# Patient Record
Sex: Male | Born: 1955 | Race: White | Hispanic: No | Smoking: Former smoker
Health system: Southern US, Community
[De-identification: ages and names within clinical notes are randomized; demographics above are authoritative.]

## PROBLEM LIST (undated history)

## (undated) DIAGNOSIS — K567 Ileus, unspecified: Secondary | ICD-10-CM

## (undated) DIAGNOSIS — D62 Acute posthemorrhagic anemia: Secondary | ICD-10-CM

## (undated) DIAGNOSIS — I219 Acute myocardial infarction, unspecified: Secondary | ICD-10-CM

## (undated) DIAGNOSIS — E876 Hypokalemia: Secondary | ICD-10-CM

## (undated) DIAGNOSIS — I10 Essential (primary) hypertension: Secondary | ICD-10-CM

## (undated) DIAGNOSIS — G8929 Other chronic pain: Secondary | ICD-10-CM

## (undated) DIAGNOSIS — D72829 Elevated white blood cell count, unspecified: Secondary | ICD-10-CM

## (undated) DIAGNOSIS — K922 Gastrointestinal hemorrhage, unspecified: Secondary | ICD-10-CM

## (undated) DIAGNOSIS — R079 Chest pain, unspecified: Secondary | ICD-10-CM

## (undated) DIAGNOSIS — K219 Gastro-esophageal reflux disease without esophagitis: Secondary | ICD-10-CM

## (undated) DIAGNOSIS — I639 Cerebral infarction, unspecified: Secondary | ICD-10-CM

## (undated) DIAGNOSIS — I739 Peripheral vascular disease, unspecified: Secondary | ICD-10-CM

## (undated) DIAGNOSIS — Z9049 Acquired absence of other specified parts of digestive tract: Secondary | ICD-10-CM

## (undated) DIAGNOSIS — R0600 Dyspnea, unspecified: Secondary | ICD-10-CM

## (undated) DIAGNOSIS — I25119 Atherosclerotic heart disease of native coronary artery with unspecified angina pectoris: Secondary | ICD-10-CM

## (undated) DIAGNOSIS — R7989 Other specified abnormal findings of blood chemistry: Secondary | ICD-10-CM

## (undated) DIAGNOSIS — K8020 Calculus of gallbladder without cholecystitis without obstruction: Secondary | ICD-10-CM

## (undated) DIAGNOSIS — J45909 Unspecified asthma, uncomplicated: Secondary | ICD-10-CM

## (undated) DIAGNOSIS — K9189 Other postprocedural complications and disorders of digestive system: Secondary | ICD-10-CM

## (undated) DIAGNOSIS — M542 Cervicalgia: Secondary | ICD-10-CM

## (undated) DIAGNOSIS — J479 Bronchiectasis, uncomplicated: Secondary | ICD-10-CM

## (undated) DIAGNOSIS — K5732 Diverticulitis of large intestine without perforation or abscess without bleeding: Secondary | ICD-10-CM

## (undated) DIAGNOSIS — E782 Mixed hyperlipidemia: Secondary | ICD-10-CM

## (undated) DIAGNOSIS — G459 Transient cerebral ischemic attack, unspecified: Secondary | ICD-10-CM

## (undated) DIAGNOSIS — M545 Low back pain: Secondary | ICD-10-CM

## (undated) DIAGNOSIS — E785 Hyperlipidemia, unspecified: Secondary | ICD-10-CM

## (undated) DIAGNOSIS — J471 Bronchiectasis with (acute) exacerbation: Secondary | ICD-10-CM

## (undated) DIAGNOSIS — M7541 Impingement syndrome of right shoulder: Secondary | ICD-10-CM

## (undated) HISTORY — DX: Acute myocardial infarction, unspecified: I21.9

## (undated) HISTORY — DX: Ileus, unspecified: K56.7

## (undated) HISTORY — PX: ROTATOR CUFF REPAIR: SHX139

## (undated) HISTORY — DX: Transient cerebral ischemic attack, unspecified: G45.9

## (undated) HISTORY — DX: Mixed hyperlipidemia: E78.2

## (undated) HISTORY — PX: CORONARY STENT PLACEMENT: SHX1402

## (undated) HISTORY — DX: Hyperlipidemia, unspecified: E78.5

## (undated) HISTORY — DX: Essential (primary) hypertension: I10

## (undated) HISTORY — DX: Other postprocedural complications and disorders of digestive system: K91.89

## (undated) HISTORY — DX: Diverticulitis of large intestine without perforation or abscess without bleeding: K57.32

## (undated) HISTORY — DX: Gastro-esophageal reflux disease without esophagitis: K21.9

## (undated) HISTORY — DX: Acute posthemorrhagic anemia: D62

## (undated) HISTORY — DX: Bronchiectasis, uncomplicated: J47.9

## (undated) HISTORY — PX: JOINT REPLACEMENT: SHX530

## (undated) HISTORY — DX: Atherosclerotic heart disease of native coronary artery with unspecified angina pectoris: I25.119

## (undated) HISTORY — DX: Impingement syndrome of right shoulder: M75.41

## (undated) HISTORY — DX: Peripheral vascular disease, unspecified: I73.9

## (undated) HISTORY — DX: Other specified abnormal findings of blood chemistry: R79.89

## (undated) HISTORY — DX: Chest pain, unspecified: R07.9

## (undated) HISTORY — DX: Gastrointestinal hemorrhage, unspecified: K92.2

## (undated) HISTORY — DX: Bronchiectasis with (acute) exacerbation: J47.1

## (undated) HISTORY — PX: TOTAL HIP ARTHROPLASTY: SHX124

## (undated) HISTORY — DX: Cerebral infarction, unspecified: I63.9

## (undated) HISTORY — DX: Acquired absence of other specified parts of digestive tract: Z90.49

## (undated) HISTORY — DX: Low back pain: M54.5

## (undated) HISTORY — DX: Elevated white blood cell count, unspecified: D72.829

## (undated) HISTORY — DX: Hypokalemia: E87.6

## (undated) HISTORY — DX: Other chronic pain: G89.29

## (undated) HISTORY — DX: Other disorders of phosphorus metabolism: E83.39

## (undated) HISTORY — DX: Calculus of gallbladder without cholecystitis without obstruction: K80.20

## (undated) HISTORY — DX: Cervicalgia: M54.2

---

## 2002-11-25 ENCOUNTER — Encounter: Payer: Self-pay | Admitting: Orthopedic Surgery

## 2002-11-25 ENCOUNTER — Inpatient Hospital Stay (HOSPITAL_COMMUNITY): Admission: RE | Admit: 2002-11-25 | Discharge: 2002-11-29 | Payer: Self-pay | Admitting: Orthopedic Surgery

## 2004-02-02 ENCOUNTER — Inpatient Hospital Stay (HOSPITAL_COMMUNITY): Admission: RE | Admit: 2004-02-02 | Discharge: 2004-02-06 | Payer: Self-pay | Admitting: Orthopedic Surgery

## 2006-11-27 ENCOUNTER — Observation Stay (HOSPITAL_COMMUNITY): Admission: RE | Admit: 2006-11-27 | Discharge: 2006-11-28 | Payer: Self-pay | Admitting: Orthopedic Surgery

## 2006-11-27 ENCOUNTER — Ambulatory Visit: Payer: Self-pay | Admitting: Cardiovascular Disease

## 2006-12-18 ENCOUNTER — Ambulatory Visit (HOSPITAL_COMMUNITY): Admission: RE | Admit: 2006-12-18 | Discharge: 2006-12-19 | Payer: Self-pay | Admitting: Orthopedic Surgery

## 2009-08-15 ENCOUNTER — Encounter: Admission: RE | Admit: 2009-08-15 | Discharge: 2009-08-15 | Payer: Self-pay | Admitting: Neurology

## 2009-11-14 ENCOUNTER — Encounter: Payer: Self-pay | Admitting: Pulmonary Disease

## 2010-01-13 ENCOUNTER — Encounter: Payer: Self-pay | Admitting: Pulmonary Disease

## 2010-03-27 ENCOUNTER — Encounter: Payer: Self-pay | Admitting: Pulmonary Disease

## 2010-05-14 ENCOUNTER — Institutional Professional Consult (permissible substitution) (INDEPENDENT_AMBULATORY_CARE_PROVIDER_SITE_OTHER): Payer: Medicaid Other | Admitting: Pulmonary Disease

## 2010-05-14 ENCOUNTER — Encounter: Payer: Self-pay | Admitting: Pulmonary Disease

## 2010-05-14 ENCOUNTER — Other Ambulatory Visit: Payer: Self-pay | Admitting: Pulmonary Disease

## 2010-05-14 ENCOUNTER — Ambulatory Visit (INDEPENDENT_AMBULATORY_CARE_PROVIDER_SITE_OTHER)
Admission: RE | Admit: 2010-05-14 | Discharge: 2010-05-14 | Disposition: A | Discharge status: Home / Self Care | Payer: Medicaid Other | Source: Ambulatory Visit | Attending: Pulmonary Disease | Admitting: Pulmonary Disease

## 2010-05-14 DIAGNOSIS — G459 Transient cerebral ischemic attack, unspecified: Secondary | ICD-10-CM

## 2010-05-14 DIAGNOSIS — I10 Essential (primary) hypertension: Secondary | ICD-10-CM | POA: Insufficient documentation

## 2010-05-14 DIAGNOSIS — J329 Chronic sinusitis, unspecified: Secondary | ICD-10-CM

## 2010-05-14 DIAGNOSIS — I219 Acute myocardial infarction, unspecified: Secondary | ICD-10-CM

## 2010-05-14 DIAGNOSIS — J189 Pneumonia, unspecified organism: Secondary | ICD-10-CM | POA: Insufficient documentation

## 2010-05-14 DIAGNOSIS — E785 Hyperlipidemia, unspecified: Secondary | ICD-10-CM | POA: Insufficient documentation

## 2010-05-14 HISTORY — DX: Essential (primary) hypertension: I10

## 2010-05-14 HISTORY — DX: Transient cerebral ischemic attack, unspecified: G45.9

## 2010-05-14 HISTORY — DX: Acute myocardial infarction, unspecified: I21.9

## 2010-05-23 ENCOUNTER — Encounter: Payer: Self-pay | Admitting: Pulmonary Disease

## 2010-05-31 NOTE — Assessment & Plan Note (Signed)
Summary:  consult for recurrent pna   Visit Type:  Initial Consult Copy to:  Gary Diaz Primary Provider/Referring Provider:  [name] Diaz  CC:  Pulmonary consult. Gary Diaz is here bc he keeps getting recurring pna and wants to know why .  History of Present Illness: the Gary Diaz is a 55y/o male who I have been asked to see for recurrent pulmonary infections.  The Gary Diaz states he was in his usual state of health with no significant pulmonary complaints, until Aug of last year when he was diagnosed with "pna".  He required hospitalization for this, and subsequently further episodes in Oct and Jan of this year.  I have a ct chest report from Aug which notes LLL infiltrate, and patchy densities in both lower lobes.  A ct report from Oct of 2011 comments on a patchy infiltrate in LLL,  but it is unclear if this ever resolved from Aug? He was admitted in Jan of this year with the discharge summary mentioning LLL infiltrate.  Films are not available from any of these studies for my review.  The Gary Diaz states that he currently feels much better, and is staying on spiriva with as needed albuterol.  He feels he can walk long distances without sob, and has no issues bringing groceries in from the car or walking up steps.  He reports a mild cough, but no significant mucus.  He had one episode of streak hemoptysis this am, but also has been having epistaxis.  He does note nasal congestion with sinus pressure and discolored mucus from nares.  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   Preventive Screening-Counseling & Management  Alcohol-Tobacco     Smoking Status: quit  Current Medications (verified): 1)  Ventolin Hfa 108 (90 Base) Mcg/act Aers (Albuterol Sulfate) .... 2 Puffs Every 4 Hrs As Needed 2)  Combivent 18-103 Mcg/act Aero (Ipratropium-Albuterol) .... 2 Puffs Every 4 Hrs As Needed 3)  Livalo 2 Mg Tabs (Pitavastatin Calcium) .... Once Daily At Bedtime 4)  Spiriva  Handihaler 18 Mcg Caps (Tiotropium Bromide Monohydrate) .... Once Daily 5)  Plavix 75 Mg Tabs (Clopidogrel Bisulfate) .... Once Daily 6)  Amlodipine Besylate 5 Mg Tabs (Amlodipine Besylate) .... Once Daily 7)  Diclofenac Sodium 75 Mg Tbec (Diclofenac Sodium) .Marland Kitchen.. 1 Two Times A Day 8)  Loratadine 10 Mg Tabs (Loratadine) .... Once Daily 9)  Multivitamins  Tabs (Multiple Vitamin) .... Once Daily 10)  Topiramate 100 Mg Tabs (Topiramate) .Marland Kitchen.. 1 Two Times A Day 11)  Omeprazole 40 Mg Cpdr (Omeprazole) .Marland Kitchen.. 1 Two Times A Day 12)  Enalapril Maleate 20 Mg Tabs (Enalapril Maleate) .... Once Daily 13)  Hydrocodone-Acetaminophen 10-650 Mg Tabs (Hydrocodone-Acetaminophen) .Marland Kitchen.. 1 Every 8 Hrs As Needed 14)  Diazepam 10 Mg Tabs (Diazepam) .Marland Kitchen.. 1 Every 8 Hrs As Needed 15)  Sennosides-Docusate Sodium 8.6-50 Mg Tabs (Sennosides-Docusate Sodium) .... 2 Two Times A Day As Needed 16)  Nitrostat 0.4 Mg Subl (Nitroglycerin) .... As Needed  Allergies (verified): 1)  ! 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 HYPERTENSION (ICD-401.9)    Past Surgical History: 3 stents placement for cad 2 hip replacements right rotator cuff repair  Family History: Reviewed history and no changes required. rheumatism--mother htn: mother allergies: mother  Social History: Reviewed history and no changes required.  Patient states former smoker. quit using cigars in 1990's. 6 cigars a day. disabled--mechanic single drinks beers occasionallySmoking Status:  quit  Review of Systems       The patient complains of shortness of breath with activity, productive cough, coughing up blood, chest pain, irregular heartbeats, acid heartburn, indigestion, loss of appetite, weight change, difficulty swallowing, sore throat, nasal  congestion/difficulty breathing through nose, sneezing, hand/feet swelling, and joint stiffness or pain.  The patient denies shortness of breath at rest, non-productive cough, abdominal pain, tooth/dental problems, headaches, itching, ear ache, anxiety, depression, rash, change in color of mucus, and fever.    Vital Signs:  Patient profile:   55 year old male Height:      69 inches Weight:      183.38 pounds BMI:     27.18 O2 Sat:      96 % on Room air Pulse rate:   82 / minute BP sitting:   132 / 80  (left arm) Cuff size:   regular  Vitals Entered By: Carver Fila (May 14, 2010 10:22 AM)  O2 Flow:  Room air CC: Pulmonary consult. Gary Diaz is here bc he keeps getting recurring pna and wants to know why  Comments meds and allergies updated Phone number updated Carver Fila  May 14, 2010 10:23 AM    Physical Exam  General:  wd male in nad Eyes:  PERRLA and EOMI.   Nose:  mild inflammatory changes, no crusting or purulence though Mouth:  elongation of soft palate and uvula, no exudates or lesions seen. Neck:  no jvd, tmg, LN Lungs:  totally clear to auscultation.  No wheezing or rhonchi Heart:  rrr, no mrg Abdomen:  soft and nontender, bs+ Extremities:  no edema or  cyanosis pulses intact distally Neurologic:  alert and oriented, moves all 4.    Impression & Recommendations:  Problem # 1:  PNEUMONIA, ORGANISM UNSPECIFIED (ICD-486) the Gary Diaz has a history of "recurrent pna" since august of last year, but unfortunately I do not have any records or xrays for review.  If this is truly the case, I would suspect that his severe GERD with probable aspiration may be contributing to this.  He also has chronic sinus symptoms that may be c/w chronic sinusutis?  This too can result in recurrent pulmonary infections.  Finally, would consider some type of anatomical issue contributing to this such as bronchiectasis.  Will need to see films, and then evaluate further.     Medications Added to  Medication List This Visit: 1)  Ventolin Hfa 108 (90 Base) Mcg/act Aers (Albuterol sulfate) .... 2 puffs every 4 hrs as needed 2)  Combivent 18-103 Mcg/act Aero (Ipratropium-albuterol) .... 2 puffs every 4 hrs as needed 3)  Livalo 2 Mg Tabs (Pitavastatin calcium) .... Once daily at bedtime 4)  Spiriva Handihaler 18 Mcg Caps (Tiotropium bromide monohydrate) .... Once daily 5)  Plavix 75 Mg Tabs (Clopidogrel bisulfate) .... Once daily 6)  Amlodipine Besylate 5 Mg Tabs (Amlodipine besylate) .... Once daily 7)  Diclofenac Sodium 75 Mg Tbec (Diclofenac sodium) .Marland Kitchen.. 1 two times a day 8)  Loratadine 10 Mg Tabs (Loratadine) .... Once daily 9)  Multivitamins Tabs (Multiple vitamin) .... Once daily 10)  Topiramate 100 Mg Tabs (Topiramate) .Marland Kitchen.. 1 two times a day 11)  Omeprazole 40 Mg Cpdr (Omeprazole) .Marland Kitchen.. 1 two times a day 12)  Enalapril Maleate 20 Mg Tabs (Enalapril maleate) .... Once daily 13)  Hydrocodone-acetaminophen 10-650 Mg Tabs (Hydrocodone-acetaminophen) .Marland Kitchen.. 1 every 8  hrs as needed 14)  Diazepam 10 Mg Tabs (Diazepam) .Marland Kitchen.. 1 every 8 hrs as needed 15)  Sennosides-docusate Sodium 8.6-50 Mg Tabs (Sennosides-docusate sodium) .... 2 two times a day as needed 16)  Nitrostat 0.4 Mg Subl (Nitroglycerin) .... As needed  Other Orders: Consultation Level IV [id number]) Radiology Referral (Radiology)  Patient Instructions: 1)  will arrange to have a disk made of all your xrays from [facility].  You will need to bring this disk to me for review 2)  will check xray of your sinuses to see if they are staying infected. 3)  will arrange followup once I have reviewed xrays.   Immunization History:  Influenza Immunization History:    Influenza:  historical (02/22/2010)  Pneumovax Immunization History:    Pneumovax:  historical (02/22/2010)

## 2010-06-07 ENCOUNTER — Telehealth: Payer: Self-pay | Admitting: *Deleted

## 2010-06-12 NOTE — Miscellaneous (Signed)
Summary: CT disc from [facility]  Clinical Lists Changes  we received disc from [facility] that pt had picked up and mailed to our office.  Unfortunatley, the disc was on a different patient.  Therefore.  called [facility] and spoke with Verlon Au in Radiology and requested a new disc be mailed to our office with cxrs and ct chest x last 6 months.Marland Kitchen awaiting on disc.  Aundra Millet Reynolds LPN  May 23, 2010 3:32 PM   received disc and put in [facility] very important look at folder.  unfortunately, they only scanned the most recent ct. not the last 6 months worth of cxr and ct chests like i had asked.  Arman Filter LPN  May 28, 1608 4:42 PM   Appended Document: CT disc from [facility] ct disc from [facility] reviewed.  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  will arrange f/u with pt.   megan, please schedule pt for ov to review his ct chest and discuss what is going on here. thanks   Appended Document: CT disc from [facility]  Appended Document: CT disc from [facility] Caller: sister/Wanda Call For: Clance Summary of Call: Patients sister Burna Mortimer phoned stated that she was returning a call to Aundra Millet they can be reached at [phone] Initial call taken by: Vedia Coffer,  June 07, 2010 2:01 PM   called and spoke with pt's sister.  informed her pt needs ov with kc.  Sister stated she will check with pt and his schedule and will call us back to schedule this appt.

## 2010-06-12 NOTE — Progress Notes (Signed)
Summary: f/u w/ kc- appt made  Phone Note Call from Patient   Caller: pt's sister rhonda dawson Call For: clance Summary of Call: says Meade Hogeland told her to call and get pt in to see kc re: results. she needs a wed or thurs morning appt. pls advise as i had offered next tues at 1:30 (avail at this time) and this would not do. [phone] Initial call taken by: Tivis Ringer, CNA,  June 07, 2010 2:26 PM  Follow-up for Phone Call        [facility] to add her on Thursday 06-21-2010 at 9:45am.  Arman Filter LPN  June 08, 2010 9:20 AM  done- nothing further needed. Tivis Ringer, CNA  June 08, 2010 11:23 AM

## 2010-06-19 ENCOUNTER — Encounter: Payer: Self-pay | Admitting: Pulmonary Disease

## 2010-06-21 ENCOUNTER — Ambulatory Visit (INDEPENDENT_AMBULATORY_CARE_PROVIDER_SITE_OTHER): Payer: Medicaid Other | Admitting: Pulmonary Disease

## 2010-06-21 ENCOUNTER — Encounter: Payer: Self-pay | Admitting: Pulmonary Disease

## 2010-06-21 VITALS — BP 110/72 | HR 75 | Temp 97.9°F | Ht 69.0 in | Wt 177.0 lb

## 2010-06-21 DIAGNOSIS — J479 Bronchiectasis, uncomplicated: Secondary | ICD-10-CM

## 2010-06-21 DIAGNOSIS — I6789 Other cerebrovascular disease: Secondary | ICD-10-CM

## 2010-06-21 HISTORY — DX: Bronchiectasis, uncomplicated: J47.9

## 2010-06-21 NOTE — Patient Instructions (Addendum)
Will evaluate your swallowing and esophagus with a barium swallow. If you get sick with a chest infection, we need to see you in order to get sputum culture prior to starting antibiotics.   Can use albuterol as needed, but stop combivent if still taking. followup with me in 6mos, but will call you with results of testing.

## 2010-06-21 NOTE — Assessment & Plan Note (Addendum)
The pt has classic bronchiectasis in both lower lobes on ct chest, and this fits with his clinical history as well.  He has a h/o swallowing issues and ?esoph stricture, so the question is raised whether this is the cause of his radiographic findings.  Would like to do barium swallow to evaluate for anatomical issues in GI tract.  I have had a long discussion with the pt about bronchiectasis, and how it is very important that we culture his sputum if he has a flareup so we can identify his colonizing flora.  His spirometry today does not show airflow obstruction, therefore unlikely to benefit from LABA/ICS unless he is having frequent exacerbations.  Will keep an eye on this.

## 2010-06-21 NOTE — Progress Notes (Signed)
  Subjective:    Patient ID: Gary Diaz, male    DOB: 23-Apr-1955, 55 y.o.   MRN: [id number]  HPI The pt comes in today for f/u of his "recurrent pna".  I have reviewed his ct on disk from [place], and he has classic bronchiectasis in both lower lobes with tiny nodular infiltrated associated (?MAC).  The pt has not had a recurrence of his chest congestion since the last visit.  He does still have swallowing issues at times, and has a h/o esoph stricture that was addressed 1-2 yrs ago.     Review of Systems  Constitutional: Positive for appetite change and unexpected weight change. Negative for fever.  HENT: Positive for congestion and sneezing. Negative for ear pain, sore throat, rhinorrhea, trouble swallowing, dental problem and postnasal drip.   Eyes: Positive for redness.  Respiratory: Positive for cough, shortness of breath and wheezing.   Cardiovascular: Negative for chest pain, palpitations and leg swelling.  Gastrointestinal: Positive for diarrhea. Negative for nausea and abdominal pain.  Genitourinary: Negative for dysuria.  Musculoskeletal: Positive for joint swelling.  Skin: Positive for rash.  Neurological: Positive for headaches. Negative for syncope.  Hematological: Does not bruise/bleed easily.  Psychiatric/Behavioral: Positive for dysphoric mood. The patient is not nervous/anxious.        Objective:   Physical Exam Ow male in nad Nares patent without discharge, but inflammed Op clear, no exudates Chest with minimal basilar crackles, no wheezing Cor with rrr, no mrg No edema LE, no cyanosis  Alert and oriented, moves all 4        Assessment & Plan:

## 2010-06-29 ENCOUNTER — Ambulatory Visit (HOSPITAL_COMMUNITY)
Admission: RE | Admit: 2010-06-29 | Discharge: 2010-06-29 | Disposition: A | Discharge status: Home / Self Care | Payer: Medicaid Other | Source: Ambulatory Visit | Attending: Pulmonary Disease | Admitting: Pulmonary Disease

## 2010-06-29 DIAGNOSIS — R1310 Dysphagia, unspecified: Secondary | ICD-10-CM | POA: Insufficient documentation

## 2010-06-29 DIAGNOSIS — I6789 Other cerebrovascular disease: Secondary | ICD-10-CM

## 2010-08-07 NOTE — Cardiovascular Report (Signed)
NAMEMarland Kitchen  [name], [name]           ACCOUNT NO.:  [id number]   MEDICAL RECORD NO.:  [id number]          PATIENT TYPE:  INP   LOCATION:  2807                         FACILITY:  [facility]   PHYSICIAN:  Noralyn Pick. Eden Emms, MD, FACCDATE OF BIRTH:  1955-05-28   DATE OF PROCEDURE:  11/27/2006  DATE OF DISCHARGE:                            CARDIAC CATHETERIZATION   Gary Diaz is a 51-year patient with previous drug-eluting stents  placed in the [facility] hospital.  He had chest pain prior to arm  surgery.  Diagnostic cath was done to assess patency.   Cine catheterization with 6-French catheters from right femoral artery.  At the end of the case AngioSeal was used with good hemostasis.   Left main coronary was normal.   Left anterior descending artery had a widely patent stent in the  proximal and mid vessel.  Between these two stents, there was a smooth  40-50% lesion.  This was at the takeoff of a small diagonal branch.  The  diagonal branch at 40% ostial disease.  The distal LAD was normal.  Circumflex coronary was nondominant.  Normal right coronary was dominant  and normal.   Left ventriculography:  RAO ventriculography was normal.  Ejection  fraction was 60%. There was no gradient across the aortic valve and no  MR.  Aortic pressure is 124/74, LV pressure is 124/10.   IMPRESSION:  The patient does not have critical disease.  The area  between the to stents is very smooth.  I suspect it was not initially  dilated due to the branch point of the diagonal and should not be  causing angina.  Since the patient's arm surgery has been cancelled, I  think the best thing to do is to discharge the patient home and resume  his Plavix.  I will call Dr. Chryl Heck and we readmit a copy of the films  for him.  He can review these.  If he is concerned about the 40-50%  lesion between the stents, it may be possible to do a pharmacological  study prior to rescheduling the surgery.  However, the fact  that the  lesion is smooth in between the two stents makes me think that it is a  low risk for perioperative event.  The stents were widely patent.  He  does not have residual disease in his RCA or circ.   The patient tolerated the procedure well.  He will be discharged later  today.  Will probably need a sling in regards to the stellate block on  his right arm.  I will talk to the anesthesiologist so I can tell the  patient what to expect in terms of recovery for this.   He will continue his current medications including the risk factor  modification with high-dose statin.  .   Addendum:  Patient had SOB in the recovery area.  CXR showed and  elevated R hemi-diaphragm.  This is likely due to his R stellate  ganglion block.  Patient will likely be admitted by Dr. Audrie Lia service  for observation.  Sats remained in the 93% range on RA.  Noralyn Pick. Eden Emms, MD, [facility]  Electronically Signed     PCN/MEDQ  D:  11/27/2006  T:  11/28/2006  Job:  [id number]   cc:   Despina Hick, M.D.

## 2010-08-07 NOTE — Consult Note (Signed)
NAMEMarland Kitchen  LARNCE, SCHNACKENBERG           ACCOUNT NO.:  [id number]   MEDICAL RECORD NO.:  [id number]          PATIENT TYPE:  INP   LOCATION:  2807                         FACILITY:  [facility]   PHYSICIAN:  Noralyn Pick. Eden Emms, MD, FACCDATE OF BIRTH:  03-14-1956   DATE OF CONSULTATION:  DATE OF DISCHARGE:                                 CONSULTATION   Gary Diaz is a 55 year old patient I was asked to consult on by Dr.  Lajoyce Corners for chest pain prior to right arm surgery.  The patient was in the  PACU.  He had just had a stellate ganglion block to prepare him for  surgery.  He had substernal chest pain.  The pain was relieved with  nitroglycerin.   His EKG showed no acute changes.  His rhythm was stable.   The patient has had non-overlapping drug-eluting stents placed at [facility] in February of this year.  The patient did not have a  myocardial infarction prior to this.   The patient in general has not had chest pain prior to his orthopedic  surgery.  The patient was cleared by his cardiologist, Olena Heckle,  MD, for shoulder surgery so long as his Plavix was not stopped.  The  patient omitted his Plavix dose this morning but Dr. Lajoyce Corners was going to  operate him without a prolonged hold on his IIb/IIIa inhibitor.   In talking to the patient, his ambulation is somewhat limited by  previous hip and vein surgery problems in his legs.  He is a nonsmoker.  Does not have significant diabetes.   He does have hypertension and hypercholesterolemia.   He has been compliant with his medications.  He is currently pain-free  after one nitroglycerin in the PACU.   His review of systems is otherwise remarkable for lack of use and  numbness in his right arm from a stellate ganglion block. Otherwise  negative   PAST MEDICAL HISTORY:  1. 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  2. The patient has also had some varicose vein-type surgery.  3. There was a  question of a previous TIA or stroke.  I do not have      carotid duplexes on him.  4. Hypertension.  5. Hypercholesterolemia.   He has no known allergies.   MEDICATIONS:  1. Omeprazole 20 mg a day.  2. Alprazolam 1 mg a day.  3. Enalapril 10 mg a day.  4. Hydrocodone.  5. Percocet.  6. Plavix 75 mg a day.  7. Indocin 25 mg a day.  8. Metoprolol 25 mg a day.  9. Nitroglycerin p.r.n.  10.Vistaril p.r.n.  11.Celexa 40 mg a day.  12.Lipitor 40 mg a day.   The patient is disabled from his hip and leg problems.  He is originally  from [place].  He has three grown children and a son that lives with  him.  He used to do mechanical work.  He used to hunt but is otherwise  fairly sedentary.  He does not smoke or drink.   He is divorced with his ex-wife living  in [place].   Family History non-contributory   His exam is remarkable for a healthy-appearing middle-aged white male in  no distress.  He has multiple tattoos.  Blood pressure is 120/70, pulse is 70 and regular.  He is afebrile.  Respiratory rate is 14.  HEENT:  Normal.  Carotids normal without bruit.  There is no lymphadenopathy, no JVP  elevation, no thyromegaly.  LUNGS:  Clear with good diaphragmatic motion, no wheezing.  There is an S1-S2 with normal heart sounds.  PMI is normal.  ABDOMEN:  Benign.  Bowel sounds are positive.  No hepatosplenomegaly, no  hepatojugular reflux.  No tenderness.  No bruits.  Femorals are +3 bilaterally without bruit.  PTs are +2.  There is no  lower extremity edema.  NEUROLOGIC:  Nonfocal.  SKIN:  Warm and dry.  There is no muscular weakness outside of the right upper extremity from  the stellate ganglion block.   His baseline EKG is normal.  There are no changes with chest pain.   Lab work is unremarkable with initial set of enzymes being negative.  Chest x-ray showed no active disease.   The remainder of his lab work is pending.   IMPRESSION:  1. Chest pain relieved by  nitroglycerin in preop for right arm      surgery.  I think the most prudent way to proceed is to do a      diagnostic heart catheterization.  The patient understands the need      to risk-stratify him for surgery.  There is no way the      anesthesiologist will allow him to proceed with surgery without      further testing.  Given the relief of pain with nitroglycerin and      the fact that he has two stents to the left anterior descending      artery, I think it is reasonable to proceed.  The risks including      stroke, need for emergency surgery, dye reaction and bleeding were      discussed.  He and his sister both agree to the procedure.  2. History of hypertension, continue stable.  Continue ACE inhibitors.  3. Premature coronary disease with hypercholesterolemia.  Continue      high-dose statin drug.  4. History of reflux by old records.  Continue omeprazole 20 mg a day.  5. Right arm pain, need for surgery.  Unfortunately, Dr. Lajoyce Corners informs      me that he will not be able to reschedule      OR time.  If the patient does not have a critical lesion in his      left anterior descending artery, he will probably be discharged      later today to follow up with his cardiologist in [facility] and      subsequently have his arm surgery rescheduled.      Noralyn Pick. Eden Emms, MD, [facility]  Electronically Signed     PCN/MEDQ  D:  11/27/2006  T:  11/27/2006  Job:  [id number]   cc:   Olena Heckle, MD

## 2010-08-07 NOTE — Op Note (Signed)
NAME:  Gary Diaz, Gary Diaz           ACCOUNT NO.:  [id number]   MEDICAL RECORD NO.:  [id number]          PATIENT TYPE:  OIB   LOCATION:  5009                         FACILITY:  [facility]   PHYSICIAN:  Nadara Mustard, MD     DATE OF BIRTH:  [birth year]   DATE OF PROCEDURE:  12/18/2006  DATE OF DISCHARGE:                               OPERATIVE REPORT   PREOPERATIVE DIAGNOSIS:  Right shoulder rotator cuff tear with  impingement syndrome and superior labral anterior posterior lesion.   POSTOPERATIVE DIAGNOSIS:  Right shoulder rotator cuff tear with  impingement syndrome and superior labral anterior posterior lesion.   PROCEDURE:  1. Right shoulder arthroscopy and debridement of rotator cuff tear,      debridement of superior labral anterior posterior lesion,      debridement of biceps tendon tear.  2. Subacromial decompression.   SURGEON:  Nadara Mustard, MD   ANESTHESIA:  General plus interscalene block.   ESTIMATED BLOOD LOSS:  Minimal.   ANTIBIOTICS:  1 gram of Kefzol.   DRAINS:  None.   COMPLICATIONS:  None.   DISPOSITION:  To PACU in stable condition.   INDICATIONS FOR PROCEDURE:  The patient is 55 year old gentleman with  chronic of rotator cuff tear of his right shoulder.  He has had pain  with activities of daily living, has failed conservative care and  presents at this time for arthroscopic intervention.  Risks and benefits  were discussed including infection, neurovascular injury, persistent  pain, need for additional surgery.  The patient states he understands  and wished proceed at this time.   DESCRIPTION OF PROCEDURE:  The patient was brought to OR room 15 after  undergoing interscalene block.  He then underwent a general anesthetic.  After adequate level of anesthesia obtained, the patient was placed in  the beach-chair position and his right upper extremity was prepped using  [name] and draped into a sterile field.  The scope was inserted in the  posterior  portal and anterior portal was established with outside-in  technique with a 18 gauge spinal.  Visualization showed SLAP lesion,  biceps tendon tear as well as partial thickness tearing of the rotator  cuff.  The rotator cuff tear, SLAP tear, and biceps tendon tear was  debrided with both the shaver and the electrocautery wand.  After  debridement, decompression, instruments were removed and the scope was  then inserted from the posterior portal into the subacromial space and  new lateral portal was established.  Visualization again showed a  partial-thickness tearing of the rotator cuff which was debrided.  The  patient had a hook type 3 acromion and underwent acromioplasty.  The  electrocautery wand was used for hemostasis.  The instruments were  removed.  The portals were closed using 2-0 nylon.  The wounds were covered with  Adaptic orthopedic sponges ABD dressing and paper tape.  The patient was  placed in a sling, extubated, taken to PACU in stable condition.  Plan  for 23 observation with discharge in the morning.      Nadara Mustard, MD  Electronically Signed  MVD/MEDQ  D:  12/18/2006  T:  12/18/2006  Job:  [id number]

## 2010-08-10 NOTE — Op Note (Signed)
NAME:  Gary Diaz, Gary Diaz                     ACCOUNT NO.:  [id number]   MEDICAL RECORD NO.:  [id number]                   PATIENT TYPE:  INP   LOCATION:  5025                                 FACILITY:  [facility]   PHYSICIAN:  Nadara Mustard, M.D.                DATE OF BIRTH:  1956-02-12   DATE OF PROCEDURE:  DATE OF DISCHARGE:  11/25/2002                                 OPERATIVE REPORT   PREOPERATIVE DIAGNOSIS:  Osteoarthritis, right hip.   POSTOPERATIVE DIAGNOSIS:  Osteoarthritis, right hip.   PROCEDURE:  Right total hip arthroplasty with an Osteonics ceramic on  ceramic with a #4 Accolate stem, 60 mm acetabulum, +0 neck, 36 mm head with  ceramic acetabular liner and femoral head.   SURGEON:  Nadara Mustard, M.D.   ANESTHESIA:  General.   ESTIMATED BLOOD LOSS:  300 mL.   ANTIBIOTICS:  1 g of Kefzol.   TOURNIQUET TIME:  None.   DISPOSITION:  To PACU in stable condition.   INDICATIONS:  The patient is a 55 year old gentleman with severe  osteoarthritis in his right hip.  The patient has failed conservative care.  He is unable to perform activities of daily living due to right hip pain and  presents at this time for right total hip arthroplasty.  The risks and  benefits were discussed including infection, neurovascular injury,  persistent pain, leg length inequality, DVT, pulmonary embolus, death and  dislocation.  The patient states that he understands and wishes to proceed  at this time.   DESCRIPTION OF PROCEDURE:  The patient was brought to operating room #5 and  underwent a general anesthetic.  After adequate levels of anesthesia were  obtained, the patient was placed in the left lateral decubitus position with  the right side up, and his right lower extremity was prepped using [name]  and draped in the sterile field.  An Gary Diaz was used to cover all exposed  skin.  A posterior lateral incision was made.  This was carried down to the  tensor fascia lata.  A  [name] was placed.  The piriformis and  short external rotators were tagged, cut and retracted.  The capsule was  T'd, tagged and retracted.  The sciatic nerve was protected and was palpated  and was protected throughout the case.  Attention was first focused on the  femoral head.  The hip was dislocated, and the femoral head was resected 1  cm proximal to the calcar.  Attention was then focused on the acetabulum.  The fibrinous tissue within the acetabulum was removed.  This was  sequentially reamed up to a 60 mm and a 60 mm acetabulum was inserted.  The  trial liner was placed, and the attention was then focused on the femur.  The femur was sequentially broached up to a #4 stem.  This was trialed with  a +CR neck with the 4-0 stem, and the patient  had a significant improvement  in his range of motion.  He had full flexion of 100 degrees, full adduction  and internal rotation of 70 degrees with no instability.  He also now has  full extension and external rotation with no dislocation.  The trial femoral  head and trial stem were removed.  The centralizer was placed in the  acetabulum.  The acetabular ceramic liner was inserted.  The femoral stem  was inserted and the femoral head was then impacted.  The hip was reduced,  again placed through a full range of motion.  The hip was stable.  The wound  was irrigated throughout the case.  Again, the wound was irrigated.  The  capsule and the short external rotators were reapproximated using a #1  Ethibond.  The tensor fascia lata was reapproximated using a running #1  Vicryl, subcutaneous closure with 2-0 Vicryl.  The skin was closed using  approximating staples.  The wound was covered with Adaptic, orthopedic  sponges, ABD dressing, Hypafix tape.  The patient was extubated and knee  immobilizer was applied.  He was taken to the PACU in stable condition.                                               Nadara Mustard, M.D.     MVD/MEDQ  D:  11/25/2002  T:  11/25/2002  Job:  [id number]

## 2010-08-10 NOTE — H&P (Signed)
   NAME:  Gary Diaz, Gary Diaz                     ACCOUNT NO.:  [id number]   MEDICAL RECORD NO.:  [id number]                   PATIENT TYPE:  INP   LOCATION:  2899                                 FACILITY:  [facility]   PHYSICIAN:  Nadara Mustard, M.D.                DATE OF BIRTH:  October 28, 1955   DATE OF ADMISSION:  11/25/2002  DATE OF DISCHARGE:                                HISTORY & PHYSICAL   HISTORY OF PRESENT ILLNESS:  The patient is a 55 year old gentleman with a  two year history of right hip pain.  The patient has noticed decreasing  range of motion, inability to perform activities of daily living, and  presents at this time for a right total hip arthroplasty.   ALLERGIES:  No known drug allergies.   MEDICATIONS:  None.   PAST HISTORY:  Esophageal dilatation in 1980.   SOCIAL HISTORY:  Negative for tobacco.  Positive alcohol.  He is single.   FAMILY HISTORY:  Positive for hypertension.   REVIEW OF SYSTEMS:  Positive for peptic ulcer disease.   PHYSICAL EXAMINATION:  VITAL SIGNS:  Temperature 97.7, pulse 76, respiratory  rate 16, blood pressure 132/84.  GENERAL:  He is in no acute distress.  LUNGS:  Clear to auscultation.  CARDIOVASCULAR:  Regular rate and rhythm.  NECK:  Supple.  No bruits.  EXTREMITIES:  Examination of the right lower extremity, he has 0 degrees of  internal and external rotation of the right hip.  He lacks about 10 degrees  of full extension and has flexion of 90 degrees.   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   ASSESSMENT:  Degenerative osteoarthritis collapse of the right hip.   PLAN:  The patient is scheduled for a right total hip arthroplasty at this  time.  The risks and benefits were discussed including infection,  neurovascular injury, leg length inequality, dislocation, DVT, and pulmonary  embolus.  The patient states he understands and wishes to  proceed at this  time.                                                Nadara Mustard, M.D.   MVD/MEDQ  D:  11/25/2002  T:  11/25/2002  Job:  [id number]

## 2010-08-10 NOTE — Discharge Summary (Signed)
NAMEYANG, RACK           ACCOUNT NO.:  [id number]   MEDICAL RECORD NO.:  [id number]          PATIENT TYPE:  INP   LOCATION:  5028                         FACILITY:  [facility]   PHYSICIAN:  Nadara Mustard, MD     DATE OF BIRTH:  Dec 31, 1955   DATE OF ADMISSION:  02/02/2004  DATE OF DISCHARGE:  02/06/2004                                 DISCHARGE SUMMARY   DISCHARGE DIAGNOSIS:  Avascular necrosis left hip.   PROCEDURE:  Left total hip arthroplasty with Osteonics components.   DISPOSITION:  Discharged to home in stable condition.   PLAN:  To follow up in the office in 1 week.  The patient set up for home  health physical therapy and occupational therapy with prescriptions for  Tylox for pain and Coumadin for DVT prophylaxis.   HISTORY OF PRESENT ILLNESS/HOSPITAL COURSE:  The patient is a 55 year old  gentleman with avascular necrosis of his left hip.  The patient has failed  conservative care and presents at this time for a left total hip  arthroplasty.  The patient underwent a left total hip arthroplasty on  February 02, 2004 with Osteonics components with a ceramic 36 mm head and a  ceramic acetabular liner.  The patient received Kefzol for infection  prophylaxis and Coumadin for DVT prophylaxis.  Postoperatively the patient  progressed well.  He progressed well with physical therapy.  He was felt to  be safe with ambulation and was discharged to home in stable condition on  November 14th with follow up in the office in 1 week with prescription for  Tylox for pain and Coumadin for DVT prophylaxis.      Vernia Buff   MVD/MEDQ  D:  03/28/2004  T:  03/28/2004  Job:  [id number]

## 2010-08-10 NOTE — Op Note (Signed)
NAMEMIVAAN, CORBITT           ACCOUNT NO.:  [id number]   MEDICAL RECORD NO.:  [id number]          PATIENT TYPE:  INP   LOCATION:  2899                         FACILITY:  [facility]   PHYSICIAN:  Nadara Mustard, MD     DATE OF BIRTH:  1955/05/02   DATE OF PROCEDURE:  02/02/2004  DATE OF DISCHARGE:                                 OPERATIVE REPORT   PREOPERATIVE DIAGNOSIS:  Avascular necrosis of the left hip.   POSTOPERATIVE DIAGNOSIS:  Avascular necrosis of the left hip.   PROCEDURE:  Left total hip arthroplasty with Osteonics components, #3  Accolade stem, 60 mm acetabulum, 0 degree ceramic liner, 36 mm ceramic head,  -5 mm neck.   SURGEON:  Nadara Mustard, MD   ANESTHESIA:  General.   ESTIMATED BLOOD LOSS:  300 mL.   ANTIBIOTICS:  One gram of Kefzol.   DISPOSITION:  To PACU in stable condition.   INDICATIONS FOR PROCEDURE:  The patient is a 55 year old gentleman with  avascular necrosis of the bilateral hips.  He is status post a right total  hip arthroplasty and has progressed to the point where he has pain with  activities of daily living and is unable to perform activities of daily  living due to left hip pain and presents at this time for a left total hip  arthroplasty.  The risks and benefits were discussed including infection,  neurovascular injury, persistent pain, dislocation, deep venous thrombosis,  pulmonary embolus and leg length inequality.  The patient states he  understands and wishes to proceed at this time.   DESCRIPTION OF PROCEDURE:  The patient was brought to OR room 4 and  underwent a general anesthetic.  After an adequate level of anesthesia was  obtained, the patient was placed in the right lateral decubitus position  with the left side up and his left lower extremity was prepped using  [name] and draped into a sterile field with Ioban used to cover all  exposed skin.  A posterolateral incision was made and this was carried down  through the  tensor fascia lata which was split.  A [name] was  placed.  The piriformis and short external rotators were tagged, cut and  retracted.  The capsule was T'd, tagged, cut and retracted.  The sciatic  nerve was visualized and this was protected throughout the case.  The hip  was dislocated and the femoral neck cut was made 1 cm proximal to the  calcar.  Attention was then focused on the acetabulum.  The acetabulum was  sequentially reamed to a 60 mm acetabular liner.  This was then fitted with  a press fit, no holes, 60 mm acetabular liner.  The wound and soft tissue  was irrigated continuously throughout the case.  The 0 degree trial liner  was then inserted into the acetabular shell.  Attention was then focused on  the femur.  The femur was sequentially broached to a size 3 Accolade stem.  This was then trialed with a -4 mm head.  The hip was dislocated.  The hip  had a negative shuck.  He  had full flexion of 120 degrees with good  stability.  He had full adduction with internal rotation of 70 degrees.  He  had full extension and external rotation.  The hip was then dislocated.  The  trial components were removed.  The +0 ceramic acetabular liner was  inserted.  The #3 Accolade stem was inserted with the -4 mm, 36 diameter  ceramic head.  The hip was reduced and again placed through a full range of  motion and this was stable. The wound was again irrigated with normal  saline.  The capsule was closed using #1 Ethibond.  The short external  rotators were reapproximated.  The tensor fascia lata was closed using a  running #1 Vicryl.  The subcutaneous was closed using 2-0 Vicryl.  The skin  was closed using approximated staples.  The wound was covered with Adaptic  orthopedic sponges and a piece of Ioban drape.  The patient was extubated  and transferred to the bed in stable condition. A knee immobilizer was  applied.  The patient was then taken to PACU in stable condition.  We  plan  for three days care on the orthopedic floor.      Vernia Buff   MVD/MEDQ  D:  02/02/2004  T:  02/02/2004  Job:  [id number]

## 2010-08-10 NOTE — Discharge Summary (Signed)
   NAME:  Gary Diaz, Gary Diaz                     ACCOUNT NO.:  [id number]   MEDICAL RECORD NO.:  [id number]                   PATIENT TYPE:  INP   LOCATION:  5025                                 FACILITY:  [facility]   PHYSICIAN:  Nadara Mustard, M.D.                DATE OF BIRTH:  June 25, 1955   DATE OF ADMISSION:  11/25/2002  DATE OF DISCHARGE:  11/29/2002                                 DISCHARGE SUMMARY   PREOPERATIVE DIAGNOSES:  Osteoarthritis right hip.   POSTOPERATIVE DIAGNOSES:  Osteoarthritis right hip.   PROCEDURE:  Right total hip arthroplasty.   DISPOSITION:  Discharged to home in stable condition.  Follow up in the  office in one week.   HISTORY OF PRESENT ILLNESS:  The patient is a 55 year old gentleman with  osteoarthritis of the right hip.  The patient has failed conservative care  and presents at this time for right total hip arthroplasty.   HOSPITAL COURSE:  Essentially unremarkable.  He underwent a right total hip  arthroplasty on September 2 with Osteonics ceramic on ceramic components  with a #4 Accolade stem, 60 mm acetabulum plus 0 neck, 36 mm head.  The  patient was treated with Kefzol for infection prophylaxis and Coumadin for  DVT prophylaxis.  The patient was ambulating well after surgery.  His  hemoglobin remained stable.  The patient was able to go up and down stairs  safely and was discharged to home in stable condition on September 6 with  prescriptions for Coumadin for DVT prophylaxis, Vicodin for pain  medications.  Plan to follow up in the office at one week.                                                Nadara Mustard, M.D.    MVD/MEDQ  D:  12/14/2002  T:  12/14/2002  Job:  [id number]

## 2010-12-27 ENCOUNTER — Ambulatory Visit: Payer: Medicaid Other | Admitting: Pulmonary Disease

## 2010-12-28 ENCOUNTER — Encounter: Payer: Self-pay | Admitting: Pulmonary Disease

## 2010-12-28 ENCOUNTER — Ambulatory Visit (INDEPENDENT_AMBULATORY_CARE_PROVIDER_SITE_OTHER): Payer: Medicaid Other | Admitting: Pulmonary Disease

## 2010-12-28 VITALS — BP 140/78 | HR 92 | Temp 98.3°F | Ht 69.0 in | Wt 198.4 lb

## 2010-12-28 DIAGNOSIS — J479 Bronchiectasis, uncomplicated: Secondary | ICD-10-CM

## 2010-12-28 NOTE — Patient Instructions (Signed)
Will give you a flu shot today No change in medications.  Let us know if you have worsening symptoms as we discussed (red flags). followup with me in 6mos.

## 2010-12-28 NOTE — Progress Notes (Signed)
  Subjective:    Patient ID: Gary Diaz, male    DOB: 06-15-1955, 55 y.o.   MRN: [id number]  HPI The patient comes in today for followup of his known bronchiectasis.  He has done very well since his last visit in March, and has not had any acute exacerbation or need for antibiotics.  He rarely uses his albuterol inhaler for rescue.  He will cough intermittently with some discolored mucus, but mainly in small quantities.   Review of Systems  Constitutional: Negative for fever and unexpected weight change.  HENT: Positive for ear pain, congestion, sore throat, sneezing and trouble swallowing. Negative for nosebleeds, rhinorrhea, dental problem, postnasal drip and sinus pressure.   Eyes: Positive for redness and itching.  Respiratory: Positive for cough, chest tightness, shortness of breath and wheezing.   Cardiovascular: Positive for palpitations and leg swelling.  Gastrointestinal: Positive for nausea. Negative for vomiting.  Genitourinary: Positive for dysuria.  Musculoskeletal: Negative for joint swelling.  Skin: Positive for rash.  Neurological: Positive for headaches.  Hematological: Does not bruise/bleed easily.  Psychiatric/Behavioral: Positive for dysphoric mood. The patient is not nervous/anxious.        Objective:   Physical Exam Well-developed male in no acute distress Nose without purulence or discharge noted Chest with minimal basilar crackles, no wheezes or rhonchi Cardiac exam with regular rate and rhythm Lower extremities with no edema, no cyanosis noted Alert and oriented, moves all 4 extremities.       Assessment & Plan:

## 2010-12-28 NOTE — Assessment & Plan Note (Signed)
The patient has known bronchiectasis, but has not had an acute exacerbation or required an antibiotic for a pulmonary infection since the last visit.  He feels that he is at his usual baseline.  I have asked him to continue on albuterol as needed, and we'll give him a flu shot today.  He will follow up in 6 months.

## 2011-01-03 LAB — BASIC METABOLIC PANEL
BUN: 16
Calcium: 9.3
GFR calc non Af Amer: >60
Potassium: 4
Sodium: 136

## 2011-01-03 LAB — APTT: aPTT: 25

## 2011-01-03 LAB — CBC
HCT: 40.6
Hemoglobin: 14.1
Platelets: 168
WBC: 6.1

## 2011-01-03 LAB — PROTIME-INR: INR: 0.9

## 2011-01-04 LAB — COMPREHENSIVE METABOLIC PANEL
ALT: 24
AST: 28
Albumin: 3.7
Alkaline Phosphatase: 59
BUN: 16
Chloride: 107
Potassium: 4
Sodium: 141
Total Bilirubin: 0.9
Total Protein: 6.5

## 2011-01-04 LAB — CBC
HCT: 40.9
Platelets: 156
WBC: 8.9

## 2011-01-04 LAB — CARDIAC PANEL(CRET KIN+CKTOT+MB+TROPI): Relative Index: INVALID

## 2011-06-28 ENCOUNTER — Ambulatory Visit: Payer: Medicaid Other | Admitting: Pulmonary Disease

## 2011-07-12 ENCOUNTER — Ambulatory Visit: Payer: Medicaid Other | Admitting: Pulmonary Disease

## 2012-11-17 ENCOUNTER — Encounter: Payer: Self-pay | Admitting: Pulmonary Disease

## 2012-11-17 ENCOUNTER — Ambulatory Visit (INDEPENDENT_AMBULATORY_CARE_PROVIDER_SITE_OTHER): Payer: Medicaid Other | Admitting: Pulmonary Disease

## 2012-11-17 VITALS — BP 138/90 | HR 57 | Temp 97.0°F | Ht 69.0 in | Wt 182.8 lb

## 2012-11-17 DIAGNOSIS — J471 Bronchiectasis with (acute) exacerbation: Secondary | ICD-10-CM | POA: Insufficient documentation

## 2012-11-17 DIAGNOSIS — J479 Bronchiectasis, uncomplicated: Secondary | ICD-10-CM

## 2012-11-17 HISTORY — DX: Bronchiectasis with (acute) exacerbation: J47.1

## 2012-11-17 MED ORDER — LEVOFLOXACIN 750 MG PO TABS: 750.0000 mg | ORAL_TABLET | Freq: Every day | ORAL | Status: DC

## 2012-11-17 MED ORDER — PREDNISONE 10 MG PO TABS: ORAL_TABLET | ORAL | Status: DC

## 2012-11-17 NOTE — Progress Notes (Signed)
  Subjective:    Patient ID: Gary Diaz, male    DOB: 27-Mar-1955, 57 y.o.   MRN: [id number]  HPI The patient comes in today for an acute sick visit.  He has a history of bronchiectasis without airflow obstruction, and has not been seen in 2 years.  He gives a two-month history of increasing chest congestion, cough with purulent mucus that is above his usual baseline, as well as increasing shortness of breath.  He denies any fevers, chills, or sweats.  He does have a history of heart disease, but denies angina like discomfort.  He does describe loud upper airway pseudo-wheezing with heavier exertional activities.   Review of Systems  Constitutional: Negative for fever and unexpected weight change.  HENT: Positive for congestion, rhinorrhea, sneezing, postnasal drip and sinus pressure. Negative for ear pain, nosebleeds, sore throat, trouble swallowing and dental problem.   Eyes: Negative for redness and itching.  Respiratory: Positive for cough, shortness of breath and wheezing. Negative for chest tightness.   Cardiovascular: Positive for chest pain. Negative for palpitations and leg swelling.  Gastrointestinal: Negative for nausea and vomiting.  Genitourinary: Negative for dysuria.  Musculoskeletal: Negative for joint swelling.  Skin: Negative for rash.  Neurological: Positive for headaches.  Hematological: Does not bruise/bleed easily.  Psychiatric/Behavioral: Negative for dysphoric mood. The patient is not nervous/anxious.        Objective:   Physical Exam Well-developed male in no acute distress Nose without purulence or discharge noted Neck without lymphadenopathy or thyromegaly Chest with fairly clear breath sounds, no wheezing Cardiac exam with regular rate and rhythm Lower extremities without edema, cyanosis Alert and oriented, moves all 4 extremities.       Assessment & Plan:

## 2012-11-17 NOTE — Patient Instructions (Addendum)
Will treat you with levaquin 750mg  one a day for 7 days to help your lung infection. Will treat with prednisone taper over an 8 day period. Continue to use albuterol as needed. followup with me in 6mos if things improve, but call if not getting better.

## 2012-11-17 NOTE — Addendum Note (Signed)
Addended by: Maisie Fus on: 11/17/2012 12:14 PM   Modules accepted: Orders

## 2012-11-17 NOTE — Assessment & Plan Note (Addendum)
The patient has had increasing chest congestion, cough, and more purulent mucus than his usual baseline over the last 2 months.  I suspect this represents a bronchiectasis flare, and we'll treat him with antibiotics to help him get back to baseline.  Not show airflow obstruction, but I suspect airway inflammation is contributing to his flareup of bronchiectasis.  We'll therefore treat him with a short course of prednisone as well.  If he continues to have symptoms despite this level of aggressive treatment, I would wonder whether his heart disease may be contributing to his symptoms.

## 2013-05-20 ENCOUNTER — Ambulatory Visit: Payer: Medicaid Other | Admitting: Pulmonary Disease

## 2013-06-09 ENCOUNTER — Ambulatory Visit (INDEPENDENT_AMBULATORY_CARE_PROVIDER_SITE_OTHER)
Admission: RE | Admit: 2013-06-09 | Discharge: 2013-06-09 | Disposition: A | Discharge status: Home / Self Care | Payer: Medicaid Other | Source: Ambulatory Visit | Attending: Pulmonary Disease | Admitting: Pulmonary Disease

## 2013-06-09 ENCOUNTER — Encounter: Payer: Self-pay | Admitting: Pulmonary Disease

## 2013-06-09 ENCOUNTER — Ambulatory Visit (INDEPENDENT_AMBULATORY_CARE_PROVIDER_SITE_OTHER): Payer: Medicaid Other | Admitting: Pulmonary Disease

## 2013-06-09 ENCOUNTER — Ambulatory Visit: Payer: Medicaid Other | Admitting: Pulmonary Disease

## 2013-06-09 VITALS — BP 144/84 | HR 93 | Temp 98.1°F | Ht 69.0 in | Wt 185.8 lb

## 2013-06-09 DIAGNOSIS — J479 Bronchiectasis, uncomplicated: Secondary | ICD-10-CM

## 2013-06-09 NOTE — Assessment & Plan Note (Signed)
The patient has known bronchiectasis, and I suspect that he is not having an acute exacerbation currently. I will check a chest x-ray today, and I've also asked him to provide [place] with a sputum specimen so that we can identify his colonizing flora. I have reviewed with him again the red flags which require treatment. He is having some dyspnea on exertion, but his lungs are totally clear today and recent spirometry showed no airflow obstruction. I wonder if this could have been related to his obstructive coronary disease.

## 2013-06-09 NOTE — Progress Notes (Signed)
   Subjective:    Patient ID: Gary Diaz, male    DOB: 1955-07-18, 58 y.o.   MRN: [id number]  HPI The patient comes in today for followup of his known bronchiectasis. He has also had recent spirometry which showed no airflow obstruction.  He comes in today where he continues to have some degree of dyspnea on exertion, and has also seen varying quantities of discolored mucus but no chest congestion. I have reminded him that he has bronchiectasis, and it is very common to produce chronically discolored mucus. The patient tells me that he has had a recent cardiac evaluation where he was found to have obstruction of his cardiac stents, and has had a percutaneous intervention. Perhaps this is the reason for his shortness of breath.   Review of Systems  Constitutional: Negative for fever and unexpected weight change.  HENT: Positive for congestion, ear pain, nosebleeds, postnasal drip, rhinorrhea and sinus pressure. Negative for dental problem, sneezing, sore throat and trouble swallowing.   Eyes: Positive for redness and itching.  Respiratory: Positive for cough, chest tightness and shortness of breath. Negative for wheezing.   Cardiovascular: Negative for palpitations and leg swelling.  Gastrointestinal: Negative for nausea and vomiting.  Genitourinary: Negative for dysuria.  Musculoskeletal: Negative for joint swelling.  Skin: Negative for rash.  Neurological: Negative for headaches.  Hematological: Does not bruise/bleed easily.  Psychiatric/Behavioral: Negative for dysphoric mood. The patient is not nervous/anxious.        Objective:   Physical Exam Well-developed male in no acute distress Nose without purulence or discharge noted Neck without lymphadenopathy or thyromegaly Chest totally clear to auscultation, no wheezing Cardiac exam with regular rate and rhythm Lower extremities without edema, no cyanosis Alert and oriented, moves all 4 extremities.        Assessment &  Plan:

## 2013-06-09 NOTE — Patient Instructions (Signed)
Will check chest xray today, and call you with results. Will give you a sputum cup today.  Please produce an early morning specimen, and keep in fridge until you can bring to the lab here.  Try and get here within a few hours of producing.  followup with me again in 72mos, but call if your quantity of mucus is increasing or your are having more congestion issues.

## 2013-12-10 ENCOUNTER — Ambulatory Visit: Payer: Medicaid Other | Admitting: Pulmonary Disease

## 2013-12-29 ENCOUNTER — Ambulatory Visit: Payer: Medicaid Other | Admitting: Pulmonary Disease

## 2014-02-01 ENCOUNTER — Ambulatory Visit (INDEPENDENT_AMBULATORY_CARE_PROVIDER_SITE_OTHER): Payer: Medicaid Other | Admitting: Pulmonary Disease

## 2014-02-01 ENCOUNTER — Encounter: Payer: Self-pay | Admitting: Pulmonary Disease

## 2014-02-01 VITALS — BP 124/76 | HR 69 | Temp 97.1°F | Ht 69.0 in | Wt 177.0 lb

## 2014-02-01 DIAGNOSIS — J479 Bronchiectasis, uncomplicated: Secondary | ICD-10-CM

## 2014-02-01 NOTE — Progress Notes (Signed)
   Subjective:    Patient ID: Gary Diaz, male    DOB: 03/17/1956, 58 y.o.   MRN: [id number]  HPI Patient comes in today for follow-up of his known bronchiectasis, without evidence for airflow obstruction. He is continuing to produce mucus at his usual baseline, and he has not had any change in quantity or character. He is describing upper airway pseudo wheezing at night when he lies down, but has been having a lot of issues with postnasal drip and allergies. He has not had increasing shortness of breath from his baseline.   Review of Systems  Constitutional: Negative for fever and unexpected weight change.  HENT: Positive for congestion and postnasal drip. Negative for dental problem, ear pain, nosebleeds, rhinorrhea, sinus pressure, sneezing, sore throat and trouble swallowing.   Eyes: Negative for redness and itching.  Respiratory: Positive for cough, chest tightness, shortness of breath and wheezing.   Cardiovascular: Negative for palpitations and leg swelling.  Gastrointestinal: Negative for nausea and vomiting.  Genitourinary: Negative for dysuria.  Musculoskeletal: Negative for joint swelling.  Skin: Negative for rash.  Neurological: Negative for headaches.  Hematological: Does not bruise/bleed easily.  Psychiatric/Behavioral: Negative for dysphoric mood. The patient is not nervous/anxious.        Objective:   Physical Exam Well-developed male in no acute distress Nose without purulence or discharge noted Neck without lymphadenopathy or thyromegaly Chest totally clear to auscultation, no wheezing Cardiac exam with regular rate and rhythm Lower extremities without edema, no cyanosis Alert and oriented, moves all 4 extremities.       Assessment & Plan:

## 2014-02-01 NOTE — Assessment & Plan Note (Signed)
The patient has been doing well from a bronchiectasis standpoint, with no acute exacerbation since the last visit. He is having classic upper airway pseudo wheezing at night when he lies down, and from his description I suspect he is having postnasal drip from allergic rhinitis. Otherwise, he feels that his breathing is stable from the last visit. His mucus production is at his normal baseline.

## 2014-02-01 NOTE — Patient Instructions (Signed)
Can try zyrtec 10mg  at night to see if helps your allergy symptoms. Stay active, and let us know if you are having increased mucus followup with me again in 58mos.

## 2014-08-02 ENCOUNTER — Ambulatory Visit (INDEPENDENT_AMBULATORY_CARE_PROVIDER_SITE_OTHER): Payer: Medicaid Other | Admitting: Pulmonary Disease

## 2014-08-02 ENCOUNTER — Encounter: Payer: Self-pay | Admitting: Pulmonary Disease

## 2014-08-02 VITALS — BP 124/60 | HR 66 | Temp 97.4°F | Ht 69.0 in | Wt 181.2 lb

## 2014-08-02 DIAGNOSIS — J479 Bronchiectasis, uncomplicated: Secondary | ICD-10-CM

## 2014-08-02 NOTE — Assessment & Plan Note (Addendum)
The patient overall has been fairly stable from a bronchiectasis standpoint. He has had no significant chest infection since last visit, although he did have an upper respiratory infection while visiting in [place]. He is not having a lot of chest congestion or mucus production, but is having dyspnea on exertion. All of his spirometry in the past have had normal flows, and he has normal flows today as well.

## 2014-08-02 NOTE — Progress Notes (Signed)
   Subjective:    Patient ID: Gary Diaz, male    DOB: 1955/10/15, 59 y.o.   MRN: [id number]  HPI Patient comes in today for follow-up of his known bronchiectasis. He has not had an acute flare since the last visit, and unfortunately never got [place] a sputum culture for examination. He feels overall that he has done well since the last visit, although he is having dyspnea on exertion. His flows have been normal in the past by spirometry, but the last check was in 2014. He has not had any recent chest infection, but did have an upper respiratory infection while visiting in [place] recently.   Review of Systems  Constitutional: Negative for fever and unexpected weight change.  HENT: Positive for congestion and postnasal drip. Negative for dental problem, ear pain, nosebleeds, rhinorrhea, sinus pressure, sneezing, sore throat and trouble swallowing.   Eyes: Negative for redness and itching.  Respiratory: Positive for cough, chest tightness, shortness of breath and wheezing.   Cardiovascular: Negative for palpitations and leg swelling.  Gastrointestinal: Negative for nausea and vomiting.  Genitourinary: Negative for dysuria.  Musculoskeletal: Negative for joint swelling.  Skin: Negative for rash.  Neurological: Negative for headaches.  Hematological: Does not bruise/bleed easily.  Psychiatric/Behavioral: Negative for dysphoric mood. The patient is not nervous/anxious.        Objective:   Physical Exam Well-developed male in no acute distress Nose without purulence or discharge noted Neck without lymphadenopathy or thyromegaly Chest totally clear to auscultation, no crackles or wheezes Cardiac exam with regular rate and rhythm Lower extremities without edema, no cyanosis Alert and oriented, moves all 4 extremities.       Assessment & Plan:

## 2014-08-02 NOTE — Patient Instructions (Signed)
Try and stay as active as possible, and work on weight loss If you get a chest cold, and can produce a sputum sample, bring by the lab within 2 hrs after producing.  followup again with Dr. Delton Coombes in 60mos.

## 2014-11-14 DIAGNOSIS — I25119 Atherosclerotic heart disease of native coronary artery with unspecified angina pectoris: Secondary | ICD-10-CM

## 2014-11-14 HISTORY — DX: Atherosclerotic heart disease of native coronary artery with unspecified angina pectoris: I25.119

## 2015-03-26 DIAGNOSIS — I639 Cerebral infarction, unspecified: Secondary | ICD-10-CM

## 2015-03-26 HISTORY — PX: COLON SURGERY: SHX602

## 2015-03-26 HISTORY — DX: Cerebral infarction, unspecified: I63.9

## 2015-05-29 DIAGNOSIS — K8020 Calculus of gallbladder without cholecystitis without obstruction: Secondary | ICD-10-CM | POA: Insufficient documentation

## 2015-05-29 DIAGNOSIS — K219 Gastro-esophageal reflux disease without esophagitis: Secondary | ICD-10-CM | POA: Insufficient documentation

## 2015-05-29 DIAGNOSIS — K5732 Diverticulitis of large intestine without perforation or abscess without bleeding: Secondary | ICD-10-CM | POA: Insufficient documentation

## 2015-05-29 HISTORY — DX: Calculus of gallbladder without cholecystitis without obstruction: K80.20

## 2015-05-29 HISTORY — DX: Diverticulitis of large intestine without perforation or abscess without bleeding: K57.32

## 2015-06-05 DIAGNOSIS — K9189 Other postprocedural complications and disorders of digestive system: Secondary | ICD-10-CM

## 2015-06-05 DIAGNOSIS — K567 Ileus, unspecified: Secondary | ICD-10-CM | POA: Insufficient documentation

## 2015-06-05 HISTORY — DX: Ileus, unspecified: K56.7

## 2015-06-12 DIAGNOSIS — D62 Acute posthemorrhagic anemia: Secondary | ICD-10-CM

## 2015-06-12 DIAGNOSIS — K922 Gastrointestinal hemorrhage, unspecified: Secondary | ICD-10-CM | POA: Insufficient documentation

## 2015-06-12 DIAGNOSIS — D72829 Elevated white blood cell count, unspecified: Secondary | ICD-10-CM

## 2015-06-12 DIAGNOSIS — Z9049 Acquired absence of other specified parts of digestive tract: Secondary | ICD-10-CM | POA: Insufficient documentation

## 2015-06-12 DIAGNOSIS — E876 Hypokalemia: Secondary | ICD-10-CM | POA: Insufficient documentation

## 2015-06-12 HISTORY — DX: Gastrointestinal hemorrhage, unspecified: K92.2

## 2015-06-12 HISTORY — DX: Elevated white blood cell count, unspecified: D72.829

## 2015-06-12 HISTORY — DX: Hypokalemia: E87.6

## 2015-06-12 HISTORY — DX: Acute posthemorrhagic anemia: D62

## 2015-06-12 HISTORY — DX: Other disorders of phosphorus metabolism: E83.39

## 2015-06-12 HISTORY — DX: Acquired absence of other specified parts of digestive tract: Z90.49

## 2016-02-29 DIAGNOSIS — E782 Mixed hyperlipidemia: Secondary | ICD-10-CM | POA: Insufficient documentation

## 2016-02-29 HISTORY — DX: Mixed hyperlipidemia: E78.2

## 2016-05-06 ENCOUNTER — Ambulatory Visit (INDEPENDENT_AMBULATORY_CARE_PROVIDER_SITE_OTHER): Payer: Self-pay | Admitting: Orthopedic Surgery

## 2016-05-09 ENCOUNTER — Ambulatory Visit (INDEPENDENT_AMBULATORY_CARE_PROVIDER_SITE_OTHER): Payer: Medicaid Other

## 2016-05-09 ENCOUNTER — Ambulatory Visit (INDEPENDENT_AMBULATORY_CARE_PROVIDER_SITE_OTHER): Payer: Medicaid Other | Admitting: Orthopedic Surgery

## 2016-05-09 VITALS — Ht 69.0 in | Wt 181.0 lb

## 2016-05-09 DIAGNOSIS — M25552 Pain in left hip: Secondary | ICD-10-CM

## 2016-05-09 DIAGNOSIS — M25511 Pain in right shoulder: Secondary | ICD-10-CM

## 2016-05-09 DIAGNOSIS — M25551 Pain in right hip: Secondary | ICD-10-CM | POA: Diagnosis not present

## 2016-05-09 DIAGNOSIS — M7541 Impingement syndrome of right shoulder: Secondary | ICD-10-CM | POA: Diagnosis not present

## 2016-05-09 HISTORY — DX: Impingement syndrome of right shoulder: M75.41

## 2016-05-09 MED ORDER — METHYLPREDNISOLONE ACETATE 40 MG/ML IJ SUSP
40.0000 mg | INTRAMUSCULAR | Status: AC | PRN
  Administered 2016-05-09: 40 mg via INTRA_ARTICULAR

## 2016-05-09 MED ORDER — LIDOCAINE HCL 1 % IJ SOLN
5.0000 mL | INTRAMUSCULAR | Status: AC | PRN
  Administered 2016-05-09: 5 mL

## 2016-05-09 NOTE — Progress Notes (Signed)
Office Visit Note   Patient: Gary Diaz           Date of Birth: 1956-01-13           MRN: [id number] Visit Date: 05/09/2016              Requested by: No referring provider defined for this encounter. PCP: Pcp Not In System  Chief Complaint  Patient presents with  . Right Shoulder - Pain  . Right Hip - Pain  . Left Hip - Pain    HPI: Patient is here for bilateral hip pain and right shoulder pain. The pt has a history of previous scope and debridement of this shoulder. He states that the should pops and has decreased ROM. Pt has pain with walking. He states that the end of last year he had " some of my gut removed" and that the post surgical complications were excessive bleeding that required him to be transfused with 16 units of PRBC. He is also very tearful today as he states that his daughter was murdered very recently as well. Autumn L Forrest, RMA  Patient complains of squeaking in the left hip.  Patient also states he's been having some left shoulder pain as well.  Assessment & Plan: Visit Diagnoses:  1. Bilateral hip pain   2. Acute pain of right shoulder   3. Impingement syndrome of right shoulder     Plan: Subacromial injection right shoulder for impingement. No intervention at this time for stable bilateral total hip arthroplasties.  Evaluate left shoulder for possible subacromial injection at follow-up. Obtained 2 view radiographs of the left shoulder follow-up.  Follow-Up Instructions: Return in about 4 weeks (around 06/06/2016).   Ortho Exam Patient is alert oriented no adenopathy well-dressed normal affect normal respiratory effort.  Patient does have an antalgic gait. He complains of groin pain bilaterally and does have pain with range of motion of his hips. Examination of the right shoulder he has full abduction and flexion has pain with Neer and Hawkins impingement test pain with drop arm test has pain to palpation of the biceps tendon.  Imaging: Xr  Hips Bilat W Or W/o Pelvis 3-4 Views  Result Date: 05/09/2016 Two-view radiographs of both hips shows stable bilateral total hip arthroplasty no lytic changes no dislocation no signs of lucency.  Xr Shoulder Right  Result Date: 05/09/2016 Three-view radiographs of the right shoulder shows a congruent glenohumeral joint there is superior migration of the humeral head within the glenoid with superior impingement.   Orders:  Orders Placed This Encounter  Procedures  . Large Joint Injection/Arthrocentesis  . XR HIPS BILAT W OR W/O PELVIS 3-4 VIEWS  . XR Shoulder Right   No orders of the defined types were placed in this encounter.    Procedures: Large Joint Inj Date/Time: 05/09/2016 1:37 PM Performed by: DUDA, MARCUS V Authorized by: Nadara Mustard   Consent Given by:  Patient Site marked: the procedure site was marked   Timeout: prior to procedure the correct patient, procedure, and site was verified   Indications:  Pain and diagnostic evaluation Location:  Shoulder Site:  R subacromial bursa Prep: patient was prepped and draped in usual sterile fashion   Needle Size:  22 G Needle Length:  1.5 inches Approach:  Posterior Ultrasound Guidance: No   Fluoroscopic Guidance: No   Arthrogram: No   Medications:  5 mL lidocaine 1 %; 40 mg methylPREDNISolone acetate 40 MG/ML Aspiration Attempted: No   Patient  tolerance:  Patient tolerated the procedure well with no immediate complications    Clinical Data: No additional findings.  Subjective: Review of Systems  Objective: Vital Signs: Ht 5\' 9"  (1.753 m)   Wt 181 lb (82.1 kg)   BMI 26.73 kg/m   Specialty Comments:  No specialty comments available.  PMFS History: Patient Active Problem List   Diagnosis Date Noted  . Impingement syndrome of right shoulder 05/09/2016  . Bronchiectasis with acute exacerbation (HCC) 11/17/2012  . Bronchiectasis without acute exacerbation (HCC) 06/21/2010  . HYPERLIPIDEMIA 05/14/2010  .  HYPERTENSION 05/14/2010  . MYOCARDIAL INFARCTION 05/14/2010  . C V A / STROKE 05/14/2010   Past Medical History:  Diagnosis Date  . CVA (cerebral vascular accident)   . GERD (gastroesophageal reflux disease)    s/p esophageal dilation for stricture  . Hyperlipidemia   . Hypertension   . Myocardial infarction (HCC)    +Sees [facility]. +CAD -recent nuclear scan with EF 40%, ?ischemia    Family History  Problem Relation Age of Onset  . Allergies Mother   . Hypertension Mother   . Rheum arthritis Mother     Past Surgical History:  Procedure Laterality Date  . CORONARY STENT PLACEMENT     x 3  . ROTATOR CUFF REPAIR     right  . 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 Smoking status: Former Smoker    Years: 10.00    Types: Cigars    Quit date: 03/25/1988  . Smokeless tobacco: Not on file     Comment: Smoked 6 cigars a day  . Alcohol use Yes  . Drug use: Unknown  . Sexual activity: Not on file

## 2016-06-05 ENCOUNTER — Encounter (INDEPENDENT_AMBULATORY_CARE_PROVIDER_SITE_OTHER): Payer: Self-pay | Admitting: Orthopedic Surgery

## 2016-06-05 ENCOUNTER — Ambulatory Visit (INDEPENDENT_AMBULATORY_CARE_PROVIDER_SITE_OTHER): Payer: Medicaid Other

## 2016-06-05 ENCOUNTER — Ambulatory Visit (INDEPENDENT_AMBULATORY_CARE_PROVIDER_SITE_OTHER): Payer: Medicaid Other | Admitting: Orthopedic Surgery

## 2016-06-05 VITALS — Ht 69.0 in | Wt 181.0 lb

## 2016-06-05 DIAGNOSIS — G8929 Other chronic pain: Secondary | ICD-10-CM

## 2016-06-05 DIAGNOSIS — M25512 Pain in left shoulder: Secondary | ICD-10-CM

## 2016-06-05 DIAGNOSIS — M542 Cervicalgia: Secondary | ICD-10-CM | POA: Diagnosis not present

## 2016-06-05 MED ORDER — PREDNISONE 10 MG PO TABS: ORAL_TABLET | ORAL | 0 refills | Status: DC

## 2016-06-05 NOTE — Progress Notes (Signed)
Office Visit Note   Patient: Gary Diaz           Date of Birth: 1955/11/17           MRN: [id number] Visit Date: 06/05/2016              Requested by: No referring provider defined for this encounter. PCP: Pcp Not In System  Chief Complaint  Patient presents with  . Left Shoulder - Pain  . Neck - Pain    HPI: Patient is here for follow up bilateral shoulders.At last office visit pt has an injection of the right shoulder and states that this has helped and it feels better. Today he is here for evaluation of his left.  He does complain of decreased ROM. He also states that he is having neck pain and has had this "for a while" He states tht the pain is going down both sides of his neck and that it feels like " a hammer is hitting me" he states that he has headaches quite often and that movement is painful. Rodena Medin, RMA  The patient is a 61 year old gentleman who presents today for follow up right shoulder. Did have a cortisone injection 4 weeks ago for impingement syndrome on the right. Is now complaining of left shoulder pain which is been chronic. Is requesting cortisone injection on the left. Pain with overhead reaching unable to reach behind his back without pain. Dull aching pain this is constant no relief from nonsteroidals.   Also complains of neck pain this is been ongoing for quite some time. States it feels like there is a hammer hitting him in the neck. This is associated with radiating pain down both sides of his neck. No weakness in either of the upper extremities. complaints of headaches that are so seated with this. Range of motion of the neck painful. flextion and extension are most painful    Assessment & Plan: Visit Diagnoses:  1. Chronic left shoulder pain   2. Neck pain     Plan: Injection left shoulder today. We will trial a prednisone taper for his neck pain. He'll follow-up in office in 4 more weeks.  Follow-Up Instructions: No Follow-up on  file.   Physical Exam  Constitutional: Appears well-developed.  Head: Normocephalic.  Eyes: EOM are normal.  Neck: Normal range of motion.  Cardiovascular: Normal rate.   Pulmonary/Chest: Effort normal.  Neurological: Is alert.  Skin: Skin is warm.  Psychiatric: Has a normal mood and affect. Neck: Does have pain with flexion and extension range of motion is intact. There is no pain with lateral movement. Spurling's negative. Back Exam   Tenderness  The patient is experiencing no tenderness.   Range of Motion  The patient has normal back ROM.  Other  Gait: normal    Left Shoulder Exam   Tenderness  The patient is experiencing tenderness in the biceps tendon.  Range of Motion  Active Abduction: 90  Passive Abduction: normal   Muscle Strength  The patient has normal left shoulder strength.  Tests  Drop Arm: negative Impingement: positive      Imaging: No results found.  Labs: No results found for: HGBA1C, ESRSEDRATE, CRP, LABURIC, REPTSTATUS, GRAMSTAIN, CULT, LABORGA  Orders:  Orders Placed This Encounter  Procedures  . Large Joint Injection/Arthrocentesis  . XR Shoulder Left  . XR Cervical Spine 2 or 3 views   Meds ordered this encounter  Medications  . predniSONE (DELTASONE) 10 MG tablet  Sig: 6 tablets for 2 days, then 5 for 2 days, then 4 for 2 days, then 3  for 2 days, then 2 for 2 days, then 1 tablet for 2 days    Dispense:  42 tablet    Refill:  0     Procedures: Large Joint Inj Date/Time: 06/05/2016 2:10 PM Performed by: Adonis Huguenin Authorized by: Barnie Del R   Consent Given by:  Patient Site marked: the procedure site was marked   Timeout: prior to procedure the correct patient, procedure, and site was verified   Indications:  Pain and diagnostic evaluation Location:  Shoulder Site:  L subacromial bursa Prep: patient was prepped and draped in usual sterile fashion   Needle Size:  22 G Needle Length:  1.5 inches Ultrasound  Guidance: No   Fluoroscopic Guidance: No   Arthrogram: No   Medications:  5 mL lidocaine 1 %; 40 mg methylPREDNISolone acetate 40 MG/ML Aspiration Attempted: No   Patient tolerance:  Patient tolerated the procedure well with no immediate complications     Clinical Data: No additional findings.  Subjective: Review of Systems  Constitutional: Negative for chills and fever.  Musculoskeletal: Positive for arthralgias and neck pain. Negative for neck stiffness.  Neurological: Negative for weakness, numbness and headaches.    Objective: Vital Signs: Ht 5\' 9"  (1.753 m)   Wt 181 lb (82.1 kg)   BMI 26.73 kg/m   Specialty Comments:  No specialty comments available.  PMFS History: Patient Active Problem List   Diagnosis Date Noted  . Impingement syndrome of right shoulder 05/09/2016  . Bronchiectasis with acute exacerbation (HCC) 11/17/2012  . Bronchiectasis without acute exacerbation (HCC) 06/21/2010  . HYPERLIPIDEMIA 05/14/2010  . HYPERTENSION 05/14/2010  . MYOCARDIAL INFARCTION 05/14/2010  . C V A / STROKE 05/14/2010   Past Medical History:  Diagnosis Date  . CVA (cerebral vascular accident) (HCC)   . GERD (gastroesophageal reflux disease)    s/p esophageal dilation for stricture  . Hyperlipidemia   . Hypertension   . Myocardial infarction    +Sees [facility]. +CAD -recent nuclear scan with EF 40%, ?ischemia    Family History  Problem Relation Age of Onset  . Allergies Mother   . Hypertension Mother   . Rheum arthritis Mother     Past Surgical History:  Procedure Laterality Date  . CORONARY STENT PLACEMENT     x 3  . ROTATOR CUFF REPAIR     right  . 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 Smoking status: Former Smoker    Years: 10.00    Types: Cigars    Quit date: 03/25/1988  . Smokeless tobacco: Not on file     Comment: Smoked 6 cigars a day  . Alcohol use Yes  .  Drug use: Unknown  . Sexual activity: Not on file

## 2016-06-10 MED ORDER — LIDOCAINE HCL 1 % IJ SOLN
5.0000 mL | INTRAMUSCULAR | Status: AC | PRN
  Administered 2016-06-05: 5 mL

## 2016-06-10 MED ORDER — METHYLPREDNISOLONE ACETATE 40 MG/ML IJ SUSP
40.0000 mg | INTRAMUSCULAR | Status: AC | PRN
  Administered 2016-06-05: 40 mg via INTRA_ARTICULAR

## 2016-07-03 ENCOUNTER — Ambulatory Visit (INDEPENDENT_AMBULATORY_CARE_PROVIDER_SITE_OTHER): Payer: Medicaid Other | Admitting: Orthopedic Surgery

## 2016-07-04 ENCOUNTER — Encounter (INDEPENDENT_AMBULATORY_CARE_PROVIDER_SITE_OTHER): Payer: Self-pay | Admitting: Orthopedic Surgery

## 2016-07-04 ENCOUNTER — Ambulatory Visit (INDEPENDENT_AMBULATORY_CARE_PROVIDER_SITE_OTHER): Payer: Medicaid Other | Admitting: Family

## 2016-07-04 VITALS — Ht 69.0 in | Wt 181.0 lb

## 2016-07-04 DIAGNOSIS — M7542 Impingement syndrome of left shoulder: Secondary | ICD-10-CM | POA: Diagnosis not present

## 2016-07-04 DIAGNOSIS — M542 Cervicalgia: Secondary | ICD-10-CM | POA: Diagnosis not present

## 2016-07-08 NOTE — Progress Notes (Signed)
Office Visit Note   Patient: Gary Diaz           Date of Birth: 09/08/1955           MRN: [id number] Visit Date: 07/04/2016              Requested by: No referring provider defined for this encounter. PCP: Pcp Not In System  Chief Complaint  Patient presents with  . Left Shoulder - Follow-up  . Neck - Follow-up    HPI: The patient is a 61 year old gentleman who presents today for follow up left shoulder and left sided neck pain. Did have a cortisone injection 8 weeks ago for impingement syndrome on the right as well as 4 weeks ago for left shoulder chronic pain. States this did provide relief. However continued left shoulder pain with overhead reaching unable to reach behind his back without pain. Dull aching pain that radiates down upper arm. this is constant. no relief from nonsteroidals.   Also complains of neck pain this is been ongoing for quite some time. This is associated with radiating pain down both sides of his neck. No weakness in either of the upper extremities. complains of headaches that are associated with this. Range of motion of the neck painful. flextion and extension are most painful. Following the prednisone taper states the neck pain has improved but is still present.   Is following with PCP for headaches, states has been trying a new medication recently. Cannot recall the name.     Assessment & Plan: Visit Diagnoses:  1. Impingement syndrome of left shoulder   2. Cervicalgia     Plan: will proceed with MRI left shoulder r/o rc pathology.  Follow-Up Instructions: Return for p mri.   Physical Exam  Constitutional: Appears well-developed.  Head: Normocephalic.  Eyes: EOM are normal.  Neck: Normal range of motion.  Cardiovascular: Normal rate.   Pulmonary/Chest: Effort normal.  Neurological: Is alert.  Skin: Skin is warm.  Psychiatric: Has a normal mood and affect. Neck: Does have pain with flexion and extension range of motion is intact.  There is no pain with lateral movement. Spurling's negative. Back Exam   Tenderness  The patient is experiencing no tenderness.   Range of Motion  The patient has normal back ROM.  Other  Gait: normal   Comments:  Negative spurlings.   Left Shoulder Exam   Tenderness  The patient is experiencing tenderness in the biceps tendon.  Range of Motion  The patient has normal left shoulder ROM.  Muscle Strength  The patient has normal left shoulder strength.  Tests  Drop Arm: negative Impingement: positive      Imaging: No results found.  Labs: No results found for: HGBA1C, ESRSEDRATE, CRP, LABURIC, REPTSTATUS, GRAMSTAIN, CULT, LABORGA  Orders:  Orders Placed This Encounter  Procedures  . MR Shoulder Left w/ contrast   No orders of the defined types were placed in this encounter.    Procedures: No procedures performed  Clinical Data: No additional findings.  Subjective: Review of Systems  Constitutional: Negative for chills and fever.  Musculoskeletal: Positive for arthralgias and neck pain. Negative for neck stiffness.  Neurological: Negative for weakness, numbness and headaches.    Objective: Vital Signs: Ht 5\' 9"  (1.753 m)   Wt 181 lb (82.1 kg)   BMI 26.73 kg/m   Specialty Comments:  No specialty comments available.  PMFS History: Patient Active Problem List   Diagnosis Date Noted  . Impingement syndrome of  right shoulder 05/09/2016  . Bronchiectasis with acute exacerbation (HCC) 11/17/2012  . Bronchiectasis without acute exacerbation (HCC) 06/21/2010  . HYPERLIPIDEMIA 05/14/2010  . HYPERTENSION 05/14/2010  . MYOCARDIAL INFARCTION 05/14/2010  . C V A / STROKE 05/14/2010   Past Medical History:  Diagnosis Date  . CVA (cerebral vascular accident) (HCC)   . GERD (gastroesophageal reflux disease)    s/p esophageal dilation for stricture  . Hyperlipidemia   . Hypertension   . Myocardial infarction (HCC)    +Sees [facility]. +CAD -recent  nuclear scan with EF 40%, ?ischemia    Family History  Problem Relation Age of Onset  . Allergies Mother   . Hypertension Mother   . Rheum arthritis Mother     Past Surgical History:  Procedure Laterality Date  . CORONARY STENT PLACEMENT     x 3  . ROTATOR CUFF REPAIR     right  . 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 Smoking status: Former Smoker    Years: 10.00    Types: Cigars    Quit date: 03/25/1988  . Smokeless tobacco: Never Used     Comment: Smoked 6 cigars a day  . Alcohol use Yes  . Drug use: Unknown  . Sexual activity: Not on file

## 2017-01-30 DIAGNOSIS — D696 Thrombocytopenia, unspecified: Secondary | ICD-10-CM | POA: Diagnosis not present

## 2017-10-21 ENCOUNTER — Encounter (INDEPENDENT_AMBULATORY_CARE_PROVIDER_SITE_OTHER): Payer: Self-pay | Admitting: Orthopedic Surgery

## 2017-10-21 ENCOUNTER — Ambulatory Visit (INDEPENDENT_AMBULATORY_CARE_PROVIDER_SITE_OTHER): Payer: Medicaid Other | Admitting: Orthopedic Surgery

## 2017-10-21 ENCOUNTER — Ambulatory Visit (INDEPENDENT_AMBULATORY_CARE_PROVIDER_SITE_OTHER): Payer: Self-pay

## 2017-10-21 ENCOUNTER — Encounter (INDEPENDENT_AMBULATORY_CARE_PROVIDER_SITE_OTHER): Payer: Self-pay

## 2017-10-21 DIAGNOSIS — G8929 Other chronic pain: Secondary | ICD-10-CM

## 2017-10-21 DIAGNOSIS — M545 Low back pain, unspecified: Secondary | ICD-10-CM | POA: Insufficient documentation

## 2017-10-21 DIAGNOSIS — M542 Cervicalgia: Secondary | ICD-10-CM

## 2017-10-21 HISTORY — DX: Cervicalgia: M54.2

## 2017-10-21 HISTORY — DX: Other chronic pain: G89.29

## 2017-10-21 HISTORY — DX: Low back pain, unspecified: M54.50

## 2017-10-21 MED ORDER — PREDNISONE 10 MG PO TABS: 20.0000 mg | ORAL_TABLET | Freq: Every day | ORAL | 0 refills | Status: DC
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
## 2017-10-23 ENCOUNTER — Telehealth (INDEPENDENT_AMBULATORY_CARE_PROVIDER_SITE_OTHER): Payer: Self-pay | Admitting: Orthopedic Surgery

## 2017-10-23 NOTE — Telephone Encounter (Signed)
I called and lm on vm to advise of message below. To call with questions.  

## 2017-10-23 NOTE — Telephone Encounter (Signed)
Medication Problem  Prednisone  Making patient Hyper and unable to sleep

## 2017-10-23 NOTE — Telephone Encounter (Signed)
Have him take only 1 pill with breakfast

## 2017-10-23 NOTE — Telephone Encounter (Signed)
Pt was seen in the office last week and states that the prednisone is making him wired and he cannot sleep please advise.

## 2017-11-18 ENCOUNTER — Encounter (INDEPENDENT_AMBULATORY_CARE_PROVIDER_SITE_OTHER): Payer: Self-pay | Admitting: Orthopedic Surgery

## 2017-11-18 ENCOUNTER — Ambulatory Visit (INDEPENDENT_AMBULATORY_CARE_PROVIDER_SITE_OTHER): Payer: Medicaid Other | Admitting: Orthopedic Surgery

## 2017-11-18 VITALS — Ht 69.0 in | Wt 181.0 lb

## 2017-11-18 DIAGNOSIS — M542 Cervicalgia: Secondary | ICD-10-CM

## 2017-11-18 MED ORDER — PREDNISONE 10 MG PO TABS: 20.0000 mg | ORAL_TABLET | Freq: Every day | ORAL | 0 refills | Status: DC
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
## 2017-12-12 ENCOUNTER — Ambulatory Visit
Admission: RE | Admit: 2017-12-12 | Discharge: 2017-12-12 | Disposition: A | Discharge status: Home / Self Care | Payer: Medicaid Other | Source: Ambulatory Visit | Attending: Orthopedic Surgery | Admitting: Orthopedic Surgery

## 2017-12-12 DIAGNOSIS — M542 Cervicalgia: Secondary | ICD-10-CM

## 2017-12-15 ENCOUNTER — Ambulatory Visit (INDEPENDENT_AMBULATORY_CARE_PROVIDER_SITE_OTHER): Payer: Medicaid Other | Admitting: Orthopedic Surgery

## 2017-12-16 ENCOUNTER — Telehealth (INDEPENDENT_AMBULATORY_CARE_PROVIDER_SITE_OTHER): Payer: Self-pay

## 2017-12-16 NOTE — Telephone Encounter (Signed)
Patient called; Left voicemail for the patient to callback pertaining to making an appt for [facility] evaluation from Dr Alvester Morin.

## 2018-01-28 NOTE — Progress Notes (Signed)
Cardiology Office Note:    Date:  01/30/2018   ID:  Gary Diaz, DOB Jul 31, 1955, MRN [id number]  PCP:  Gary Houston, MD  Cardiologist:  Gary More, MD    Referring MD: Gary Flock, PA-C    ASSESSMENT:    1. Coronary artery disease involving native coronary artery of native heart with angina pectoris ([place])   2. Essential hypertension   3. Hyperlipidemia, unspecified hyperlipidemia type    PLAN:    In order of problems listed above:  1. His CAD is stable exertional angina on maximum medical therapy clopidogrel statin beta-blocker oral nitrates ranolazine time would not repeat an ischemia evaluation unless he has change in his pattern and continue his current medical treatment. 2. Stable blood pressure target continue current treatment 3. He will continue his statin labs requested from his PCP   Next appointment: 37-month  Medication Adjustments/Labs and Tests Ordered: Current medicines are reviewed at length with the patient today.  Concerns regarding medicines are outlined above.  No orders of the defined types were placed in this encounter.  No orders of the defined types were placed in this encounter.   Chief Complaint  Patient presents with  . Coronary Artery Disease    History of Present Illness:    Gary DORNFELDis a 62y.o. male with a hx of CAD [facility]  [facility] his resting ejection fraction was normal and  was interpreted as normal no evidence of ischemia his exercise tolerance was 7 METS and his heart rate and blood pressure response was normal.  Is a history of CAD with remote PCI and stent to left anterior descending coronary artery and last coronary angiogram in 2017 showed mild nonobstructive CAD is other problems include hypertension hyperlipidemia and previous TIA Compliance with diet, lifestyle and medications:  Yes  Pattern of angina stable he had deaths in his family took nitroglycerin with grief and also has exertional angina when he does Diaz than usual activities with upper extremities were relieved again with nitroglycerin.  Shortness of breath edema palpitation or syncope Past Medical History:  Diagnosis Date  . Acute blood loss anemia 06/12/2015  . Acute lower GI bleeding 06/12/2015  . Bronchie[place]asis [place] exacerbation ([place] 11/17/2012  . [place]ation ([place] 06/21/2010   Ct chest 2011:  bilat lower lobe bronchiectasis with tiny nodular infiltrates scattered in the area (?MAC colonization) Spirometry 2012:  No airflow obstruction Spirometry 2014:  Normal.  SArlyce Harman2016:  Normal flows.    . Cervicalgia 10/21/2017  . Cholelithiasis 05/29/2015  . Chronic midline low back pain without sciatica 10/21/2017  . Coronary artery disease involving native coronary artery with angina pectoris ([place] 11/14/2014   Overview:  1.Taxus Stent to the LAD 06/30/07 with patent previous LAD stents  2. Cath 05/21/2011 without restenosis or obstructive stenosis 3. Cath 03/04/13 with patent stent, EF 55-60%.  . CVA (cerebral vascular accident) ([place]   . Diverticulitis of rectosigmoid 05/29/2015  . Essential hypertension 05/14/2010   Qualifier: Diagnosis of  By: SCharma Igo   . GERD (gastroesophageal reflux disease)    s/p esophageal dilation for stricture  . 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 Hyperlipidemia   . Hypertension   . Hypokalemia 06/12/2015  . Hypophosphatemia 06/12/2015  . Impingement syndrome of right shoulder 05/09/2016  . Leukocytosis 06/12/2015  . Mixed hyperlipidemia 02/29/2016  Overview:  Added automatically from request for surgery [id number]  . MYOCARDIAL INFARCTION 05/14/2010   Qualifier: History of  By: Charma Igo    . Myocardial infarction ([place])    +Sees Shelda Truby. +CAD -recent nuclear scan with EF 40%, ?ischemia  . Postoperative ileus ([place]) 06/05/2015  .  TIA (transient ischemic attack) 05/14/2010   Qualifier: Diagnosis of  By: Charma Igo      Past Surgical History:  Procedure Laterality Date  . CORONARY STENT PLACEMENT     x 3  . ROTATOR CUFF REPAIR     right  . TOTAL HIP ARTHROPLASTY     x 2    Current Medications: Current Meds  Medication Sig  . Albuterol Sulfate (PROAIR HFA IN) Inhale 2 puffs into the lungs every 4 (four) hours as needed.  Marland Kitchen atorvastatin (LIPITOR) 40 MG tablet Take 40 mg by mouth daily.   . baclofen (LIORESAL) 10 MG tablet Take 10 mg by mouth 3 (three) times daily.  . carvedilol (COREG) 12.5 MG tablet Take 12.5 mg by mouth 2 (two) times daily with a meal.  . cetirizine (ZYRTEC) 10 MG tablet Take 10 mg by mouth daily.   . citalopram (CELEXA) 40 MG tablet Take 40 mg by mouth daily.  . clopidogrel (PLAVIX) 75 MG tablet Take 75 mg by mouth daily.    Marland Kitchen docusate sodium (COLACE) 50 MG capsule Take 50 mg by mouth daily as needed for mild constipation.  . fluticasone (FLONASE) 50 MCG/ACT nasal spray Place 2 sprays into both nostrils as needed for allergies or rhinitis.  Marland Kitchen isosorbide mononitrate (IMDUR) 60 MG 24 hr tablet Take 60 mg by mouth daily.  . multivitamin (THERAGRAN) per tablet Take 1 tablet by mouth daily.    . nitroGLYCERIN (NITROSTAT) 0.4 MG SL tablet Place 0.4 mg under the tongue every 5 (five) minutes as needed.    . Olopatadine HCl (PATADAY) 0.2 % SOLN Apply to eye as needed.  Marland Kitchen omeprazole (PRILOSEC) 40 MG capsule Take 40 mg by mouth 2 (two) times daily.   . Oxycodone HCl 10 MG TABS Take 10 mg by mouth every 8 (eight) hours as needed. for pain  . ranolazine (RANEXA) 500 MG 12 hr tablet Take 500 mg by mouth 2 (two) times daily.  . tamsulosin (FLOMAX) 0.4 MG CAPS capsule Take 0.4 mg by mouth at bedtime.     Allergies:   Latex   Social History   Socioeconomic History  . Marital status: Divorced    Spouse name: Not on file  . Number of children: Not on file  . Years of education: Not on file  .  Highest education level: Not on file  Occupational History  . Occupation: disabled    Comment: Dealer  Social Needs  . Financial resource strain: Not on file  . Food insecurity:    Worry: Not on file    Inability: Not on file  . Transportation needs:    Medical: Not on file    Non-medical: Not on file  Tobacco Use  . Smoking status: Former Smoker    Years: 10.00    Types: Cigars    Last attempt to quit: 03/25/1988    Years since quitting: 29.8  . Smokeless tobacco: Former Systems developer    Types: Chew  . Tobacco comment: Smoked 6 cigars a day  Substance and Sexual Activity  . Alcohol use: Yes    Comment: occ  . Drug use: Not Currently  . Sexual activity: Not on file  Lifestyle  . Physical activity:    Days per week: Not on file    Minutes per session: Not on file  . Stress: Not on file  Relationships  . Social connections:    Talks on phone: Not on file    Gets together: Not on file    Attends religious service: Not on file    Active member of club or organization: Not on file    Attends meetings of clubs or organizations: Not on file    Relationship status: Not on file  Other Topics Concern  . Not on file  Social History Narrative  . Not on file     Family History: The patient's family history includes Allergies in his mother; Hypertension in his mother; Rheum arthritis in his mother. ROS:   Please see the history of present illness.    All other systems reviewed and are negative.  EKGs/Labs/Other Studies Reviewed:    The following studies were reviewed today:  EKG:  EKG ordered today.  The ekg ordered today demonstrates sinus rhythm and normal  Recent Labs: No results found for requested labs within last 8760 hours.  Recent Lipid Panel No results found for: CHOL, TRIG, HDL, CHOLHDL, VLDL, LDLCALC, LDLDIRECT  Physical Exam:    VS:  BP 90/60 (BP Location: Right Arm, Patient Position: Sitting, Cuff Size: Normal)   Pulse 74   Ht 5' 7"  (1.702 m)   Wt 182 lb (82.6  kg)   SpO2 95%   BMI 28.51 kg/m     Wt Readings from Last 3 Encounters:  01/30/18 182 lb (82.6 kg)  11/18/17 181 lb (82.1 kg)  07/04/16 181 lb (82.1 kg)     GEN:  Well nourished, well developed in no acute distress HEENT: Normal NECK: No JVD; No carotid bruits LYMPHATICS: No lymphadenopathy CARDIAC: RRR, no murmurs, rubs, gallops RESPIRATORY:  Clear to auscultation without rales, wheezing or rhonchi  ABDOMEN: Soft, non-tender, non-distended MUSCULOSKELETAL:  No edema; No deformity  SKIN: Warm and dry NEUROLOGIC:  Alert and oriented x 3 PSYCHIATRIC:  Normal affect    Signed, Gary More, MD  01/30/2018 3:30 PM    [place]

## 2018-01-30 ENCOUNTER — Encounter: Payer: Self-pay | Admitting: *Deleted

## 2018-01-30 ENCOUNTER — Ambulatory Visit: Payer: Medicaid Other | Admitting: Cardiology

## 2018-01-30 VITALS — BP 90/60 | HR 74 | Ht 67.0 in | Wt 182.0 lb

## 2018-01-30 DIAGNOSIS — I25119 Atherosclerotic heart disease of native coronary artery with unspecified angina pectoris: Secondary | ICD-10-CM

## 2018-01-30 DIAGNOSIS — I10 Essential (primary) hypertension: Secondary | ICD-10-CM

## 2018-01-30 DIAGNOSIS — E785 Hyperlipidemia, unspecified: Secondary | ICD-10-CM

## 2018-01-30 NOTE — Patient Instructions (Signed)

## 2018-02-02 ENCOUNTER — Encounter: Payer: Self-pay | Admitting: Cardiology

## 2018-11-21 ENCOUNTER — Other Ambulatory Visit: Payer: Self-pay

## 2018-11-21 ENCOUNTER — Emergency Department (HOSPITAL_COMMUNITY): Payer: Medicare Other

## 2018-11-21 ENCOUNTER — Inpatient Hospital Stay (HOSPITAL_COMMUNITY)
Admission: EM | Admit: 2018-11-21 | Discharge: 2018-11-23 | DRG: 280 | Disposition: A | Discharge status: Home / Self Care | Payer: Medicare Other | Attending: Cardiovascular Disease | Admitting: Cardiovascular Disease

## 2018-11-21 DIAGNOSIS — I5021 Acute systolic (congestive) heart failure: Secondary | ICD-10-CM | POA: Diagnosis present

## 2018-11-21 DIAGNOSIS — M545 Low back pain: Secondary | ICD-10-CM | POA: Diagnosis present

## 2018-11-21 DIAGNOSIS — I2510 Atherosclerotic heart disease of native coronary artery without angina pectoris: Secondary | ICD-10-CM | POA: Diagnosis present

## 2018-11-21 DIAGNOSIS — Z20828 Contact with and (suspected) exposure to other viral communicable diseases: Secondary | ICD-10-CM | POA: Diagnosis present

## 2018-11-21 DIAGNOSIS — G8929 Other chronic pain: Secondary | ICD-10-CM | POA: Diagnosis present

## 2018-11-21 DIAGNOSIS — I214 Non-ST elevation (NSTEMI) myocardial infarction: Principal | ICD-10-CM

## 2018-11-21 DIAGNOSIS — K219 Gastro-esophageal reflux disease without esophagitis: Secondary | ICD-10-CM | POA: Diagnosis present

## 2018-11-21 DIAGNOSIS — E782 Mixed hyperlipidemia: Secondary | ICD-10-CM | POA: Diagnosis present

## 2018-11-21 DIAGNOSIS — Z8249 Family history of ischemic heart disease and other diseases of the circulatory system: Secondary | ICD-10-CM

## 2018-11-21 DIAGNOSIS — R7989 Other specified abnormal findings of blood chemistry: Secondary | ICD-10-CM | POA: Diagnosis not present

## 2018-11-21 DIAGNOSIS — Z79899 Other long term (current) drug therapy: Secondary | ICD-10-CM | POA: Diagnosis not present

## 2018-11-21 DIAGNOSIS — I110 Hypertensive heart disease with heart failure: Secondary | ICD-10-CM | POA: Diagnosis present

## 2018-11-21 DIAGNOSIS — Z7902 Long term (current) use of antithrombotics/antiplatelets: Secondary | ICD-10-CM

## 2018-11-21 DIAGNOSIS — I252 Old myocardial infarction: Secondary | ICD-10-CM

## 2018-11-21 DIAGNOSIS — I248 Other forms of acute ischemic heart disease: Secondary | ICD-10-CM | POA: Diagnosis present

## 2018-11-21 DIAGNOSIS — Z96649 Presence of unspecified artificial hip joint: Secondary | ICD-10-CM | POA: Diagnosis present

## 2018-11-21 DIAGNOSIS — R778 Other specified abnormalities of plasma proteins: Secondary | ICD-10-CM

## 2018-11-21 DIAGNOSIS — Z87891 Personal history of nicotine dependence: Secondary | ICD-10-CM

## 2018-11-21 DIAGNOSIS — Z8673 Personal history of transient ischemic attack (TIA), and cerebral infarction without residual deficits: Secondary | ICD-10-CM | POA: Diagnosis not present

## 2018-11-21 DIAGNOSIS — Z955 Presence of coronary angioplasty implant and graft: Secondary | ICD-10-CM | POA: Diagnosis not present

## 2018-11-21 DIAGNOSIS — Z91040 Latex allergy status: Secondary | ICD-10-CM | POA: Diagnosis not present

## 2018-11-21 DIAGNOSIS — R079 Chest pain, unspecified: Secondary | ICD-10-CM

## 2018-11-21 DIAGNOSIS — I10 Essential (primary) hypertension: Secondary | ICD-10-CM

## 2018-11-21 DIAGNOSIS — E7800 Pure hypercholesterolemia, unspecified: Secondary | ICD-10-CM

## 2018-11-21 DIAGNOSIS — R0789 Other chest pain: Secondary | ICD-10-CM | POA: Diagnosis not present

## 2018-11-21 HISTORY — DX: Non-ST elevation (NSTEMI) myocardial infarction: I21.4

## 2018-11-21 LAB — CBC WITH DIFFERENTIAL/PLATELET
Abs Immature Granulocytes: 0.02 10*3/uL (ref 0.00–0.07)
Basophils Absolute: 0 10*3/uL (ref 0.0–0.1)
Basophils Relative: 0 %
Eosinophils Absolute: 0.1 10*3/uL (ref 0.0–0.5)
Eosinophils Relative: 2 %
HCT: 44.2 % (ref 39.0–52.0)
Hemoglobin: 14.9 g/dL (ref 13.0–17.0)
Immature Granulocytes: 0 %
Lymphocytes Relative: 19 %
Lymphs Abs: 1.1 10*3/uL (ref 0.7–4.0)
MCH: 31.3 pg (ref 26.0–34.0)
MCHC: 33.7 g/dL (ref 30.0–36.0)
MCV: 92.9 fL (ref 80.0–100.0)
Monocytes Absolute: 0.6 10*3/uL (ref 0.1–1.0)
Monocytes Relative: 9 %
Neutro Abs: 4.1 10*3/uL (ref 1.7–7.7)
Neutrophils Relative %: 70 %
Platelets: 95 10*3/uL — ABNORMAL LOW (ref 150–400)
RBC: 4.76 MIL/uL (ref 4.22–5.81)
RDW: 18.2 % — ABNORMAL HIGH (ref 11.5–15.5)
WBC: 5.8 10*3/uL (ref 4.0–10.5)
nRBC: 0 % (ref 0.0–0.2)

## 2018-11-21 LAB — BASIC METABOLIC PANEL
Anion gap: 8 (ref 5–15)
BUN: 7 mg/dL — ABNORMAL LOW (ref 8–23)
CO2: 29 mmol/L (ref 22–32)
Calcium: 8.5 mg/dL — ABNORMAL LOW (ref 8.9–10.3)
Chloride: 105 mmol/L (ref 98–111)
Creatinine, Ser: 0.84 mg/dL (ref 0.61–1.24)
GFR calc Af Amer: >60 mL/min (ref >60)
GFR calc non Af Amer: >60 mL/min (ref >60)
Glucose, Bld: 106 mg/dL — ABNORMAL HIGH (ref 70–99)
Potassium: 3.9 mmol/L (ref 3.5–5.1)
Sodium: 142 mmol/L (ref 135–145)

## 2018-11-21 LAB — SARS CORONAVIRUS 2 BY RT PCR (HOSPITAL ORDER, PERFORMED IN ~~LOC~~ HOSPITAL LAB): SARS Coronavirus 2: NEGATIVE

## 2018-11-21 LAB — TROPONIN I (HIGH SENSITIVITY)
Troponin I (High Sensitivity): 187 ng/L (ref <18)
Troponin I (High Sensitivity): 780 ng/L (ref <18)

## 2018-11-21 LAB — BRAIN NATRIURETIC PEPTIDE: B Natriuretic Peptide: 35.2 pg/mL (ref 0.0–100.0)

## 2018-11-21 MED ORDER — FLUTICASONE PROPIONATE 50 MCG/ACT NA SUSP
2.0000 | NASAL | Status: DC | PRN
  Filled 2018-11-21: qty 16

## 2018-11-21 MED ORDER — ALBUTEROL SULFATE (2.5 MG/3ML) 0.083% IN NEBU: 2.5000 mg | INHALATION_SOLUTION | RESPIRATORY_TRACT | Status: DC | PRN

## 2018-11-21 MED ORDER — NITROGLYCERIN 0.4 MG SL SUBL
0.4000 mg | SUBLINGUAL_TABLET | SUBLINGUAL | Status: DC | PRN
  Administered 2018-11-22 (×3): 0.4 mg via SUBLINGUAL
  Filled 2018-11-21: qty 1

## 2018-11-21 MED ORDER — HEPARIN BOLUS VIA INFUSION
4000.0000 [IU] | Freq: Once | INTRAVENOUS | Status: AC
  Administered 2018-11-21: 4000 [IU] via INTRAVENOUS
  Filled 2018-11-21: qty 4000

## 2018-11-21 MED ORDER — TAMSULOSIN HCL 0.4 MG PO CAPS
0.4000 mg | ORAL_CAPSULE | Freq: Every day | ORAL | Status: DC
  Administered 2018-11-21 – 2018-11-22 (×2): 0.4 mg via ORAL
  Filled 2018-11-21 (×2): qty 1

## 2018-11-21 MED ORDER — PANTOPRAZOLE SODIUM 40 MG PO TBEC
40.0000 mg | DELAYED_RELEASE_TABLET | Freq: Every day | ORAL | Status: DC
  Administered 2018-11-22 – 2018-11-23 (×2): 40 mg via ORAL
  Filled 2018-11-21 (×2): qty 1

## 2018-11-21 MED ORDER — ISOSORBIDE MONONITRATE ER 60 MG PO TB24: 60.0000 mg | ORAL_TABLET | Freq: Every day | ORAL | Status: DC

## 2018-11-21 MED ORDER — ASPIRIN 81 MG PO CHEW
324.0000 mg | CHEWABLE_TABLET | Freq: Once | ORAL | Status: AC
  Administered 2018-11-21: 14:00:00 324 mg via ORAL
  Filled 2018-11-21: qty 4

## 2018-11-21 MED ORDER — RANOLAZINE ER 500 MG PO TB12
500.0000 mg | ORAL_TABLET | Freq: Two times a day (BID) | ORAL | Status: DC
  Administered 2018-11-21 – 2018-11-23 (×4): 500 mg via ORAL
  Filled 2018-11-21 (×4): qty 1

## 2018-11-21 MED ORDER — OLOPATADINE HCL 0.1 % OP SOLN
1.0000 [drp] | Freq: Two times a day (BID) | OPHTHALMIC | Status: DC
  Administered 2018-11-21 – 2018-11-23 (×4): 1 [drp] via OPHTHALMIC
  Filled 2018-11-21: qty 5

## 2018-11-21 MED ORDER — ASPIRIN 81 MG PO CHEW: 324.0000 mg | CHEWABLE_TABLET | ORAL | Status: AC

## 2018-11-21 MED ORDER — ACETAMINOPHEN 325 MG PO TABS
650.0000 mg | ORAL_TABLET | ORAL | Status: DC | PRN
  Administered 2018-11-22: 650 mg via ORAL
  Filled 2018-11-21: qty 2

## 2018-11-21 MED ORDER — CITALOPRAM HYDROBROMIDE 20 MG PO TABS
40.0000 mg | ORAL_TABLET | Freq: Every day | ORAL | Status: DC
  Administered 2018-11-22 – 2018-11-23 (×2): 40 mg via ORAL
  Filled 2018-11-21 (×2): qty 2

## 2018-11-21 MED ORDER — DOCUSATE SODIUM 100 MG PO CAPS: 100.0000 mg | ORAL_CAPSULE | Freq: Every day | ORAL | Status: DC | PRN

## 2018-11-21 MED ORDER — HEPARIN (PORCINE) 25000 UT/250ML-% IV SOLN
1100.0000 [IU]/h | INTRAVENOUS | Status: DC
  Administered 2018-11-21: 17:00:00 950 [IU]/h via INTRAVENOUS
  Administered 2018-11-22: 15:00:00 1100 [IU]/h via INTRAVENOUS
  Filled 2018-11-21 (×2): qty 250

## 2018-11-21 MED ORDER — NITROGLYCERIN 0.4 MG SL SUBL
0.4000 mg | SUBLINGUAL_TABLET | SUBLINGUAL | Status: DC | PRN
  Administered 2018-11-21 (×3): 0.4 mg via SUBLINGUAL
  Filled 2018-11-21 (×3): qty 1

## 2018-11-21 MED ORDER — LORATADINE 10 MG PO TABS
10.0000 mg | ORAL_TABLET | Freq: Every day | ORAL | Status: DC
  Administered 2018-11-22 – 2018-11-23 (×2): 10 mg via ORAL
  Filled 2018-11-21 (×2): qty 1

## 2018-11-21 MED ORDER — MORPHINE SULFATE (PF) 2 MG/ML IV SOLN
2.0000 mg | INTRAVENOUS | Status: DC | PRN
  Administered 2018-11-21 – 2018-11-22 (×2): 2 mg via INTRAVENOUS
  Filled 2018-11-21 (×2): qty 1

## 2018-11-21 MED ORDER — CARVEDILOL 12.5 MG PO TABS
12.5000 mg | ORAL_TABLET | Freq: Two times a day (BID) | ORAL | Status: DC
  Administered 2018-11-21 – 2018-11-23 (×4): 12.5 mg via ORAL
  Filled 2018-11-21 (×4): qty 1

## 2018-11-21 MED ORDER — ATORVASTATIN CALCIUM 80 MG PO TABS
80.0000 mg | ORAL_TABLET | Freq: Every day | ORAL | Status: DC
  Administered 2018-11-21 – 2018-11-22 (×2): 80 mg via ORAL
  Filled 2018-11-21 (×2): qty 1

## 2018-11-21 MED ORDER — ONDANSETRON HCL 4 MG/2ML IJ SOLN: 4.0000 mg | Freq: Four times a day (QID) | INTRAMUSCULAR | Status: DC | PRN

## 2018-11-21 MED ORDER — OXYCODONE HCL 5 MG PO TABS: 10.0000 mg | ORAL_TABLET | Freq: Three times a day (TID) | ORAL | Status: DC | PRN

## 2018-11-21 MED ORDER — ASPIRIN EC 81 MG PO TBEC
81.0000 mg | DELAYED_RELEASE_TABLET | Freq: Every day | ORAL | Status: DC
  Administered 2018-11-22 – 2018-11-23 (×2): 81 mg via ORAL
  Filled 2018-11-21: qty 1

## 2018-11-21 MED ORDER — ASPIRIN 300 MG RE SUPP: 300.0000 mg | RECTAL | Status: AC

## 2018-11-21 NOTE — ED Provider Notes (Signed)
[facility] EMERGENCY DEPARTMENT Provider Note   CSN: [id number] Arrival date & time: 11/21/18  1236     History   Chief Complaint Chief Complaint  Patient presents with   Chest Pain    HPI Gary Diaz is a 63 y.o. male with past medical history of CAD with multiple stents, MI, GERD, CVA, hypertension, presenting to the emergency department via EMS with complaint of sudden onset of central chest pain that began around 8 AM this morning.  He states he was sitting on the chair watching TV when he began having this terrible severe sharp central chest pain that radiated to both arms.  He states he felt shortness of breath and diaphoresis with this.  He took 2 nitroglycerin at home and pain somewhat improved, however still remained 7/10 severity.  He called EMS who then gave him 8 nitroglycerin and his pain has improved down to 5/10 in severity.  On evaluation he still complaining of 5/10 severity sharp central chest pain.  He has a history of stable angina, he states his chest pain today was much more severe than that.  He takes Plavix daily, does not take aspirin.  He is followed by Dr. Bettina Gavia with cardiology.     The history is provided by the patient and medical records.    Past Medical History:  Diagnosis Date   Acute blood loss anemia 06/12/2015   Acute lower GI bleeding 06/12/2015   Bronchiectasis with acute exacerbation ([place]) 11/17/2012   Bronchiectasis without acute exacerbation ([place]) 06/21/2010   Ct chest 2011:  bilat lower lobe bronchiectasis with tiny nodular infiltrates scattered in the area (?MAC colonization) Spirometry 2012:  No airflow obstruction Spirometry 2014:  Normal.  [place] 2016:  Normal flows.     Cervicalgia 10/21/2017   Cholelithiasis 05/29/2015   Chronic midline low back pain without sciatica 10/21/2017   Coronary artery disease involving native coronary artery with angina pectoris ([place]) 11/14/2014   Overview:  1.Taxus Stent to the LAD  06/30/07 with patent previous LAD stents  2. Cath 05/21/2011 without restenosis or obstructive stenosis 3. Cath 03/04/13 with patent stent, EF 55-60%.   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 +CAD -recent nuclear scan with EF 40%, ?ischemia   Postoperative ileus ([place]) 06/05/2015   TIA (transient ischemic attack) 05/14/2010   Qualifier: Diagnosis of  By: Charma Igo      Patient Active Problem List   Diagnosis Date Noted   NSTEMI (non-ST elevated myocardial infarction) ([place]) 11/21/2018   Cervicalgia 10/21/2017   Chronic midline low back pain without sciatica 10/21/2017   Impingement syndrome of right shoulder 05/09/2016   Mixed hyperlipidemia 02/29/2016   Acute lower GI bleeding 06/12/2015   History of low anterior resection of rectum 06/12/2015   Hypokalemia 06/12/2015   Hypophosphatemia 06/12/2015   Leukocytosis  06/12/2015   Acute blood loss anemia 06/12/2015   Postoperative ileus ([place]) 06/05/2015   Cholelithiasis 05/29/2015   Diverticulitis of rectosigmoid 05/29/2015   GERD (gastroesophageal reflux disease) 05/29/2015   Coronary artery disease involving native coronary artery with angina pectoris ([place]) 11/14/2014   Bronchiectasis with acute exacerbation ([place]) 11/17/2012   Bronchiectasis without acute  exacerbation ([place]) 06/21/2010   Hyperlipidemia 05/14/2010   Essential hypertension 05/14/2010   MYOCARDIAL INFARCTION 05/14/2010   TIA (transient ischemic attack) 05/14/2010    Past Surgical History:  Procedure Laterality Date   CORONARY STENT PLACEMENT     x 3   ROTATOR CUFF REPAIR     right   TOTAL HIP ARTHROPLASTY     x 2        Home Medications    Prior to Admission medications   Medication Sig Start Date End Date Taking? Authorizing Provider  albuterol (PROAIR HFA) 108 (90 Base) MCG/ACT inhaler Inhale 2 puffs into the lungs every 4 (four) hours as needed (shortness of breath).    Yes [provider]  atorvastatin (LIPITOR) 40 MG tablet Take 40 mg by mouth daily.    Yes [provider]  carvedilol (COREG) 12.5 MG tablet Take 12.5 mg by mouth 2 (two) times daily with a meal.   Yes [provider]  cetirizine (ZYRTEC) 10 MG tablet Take 10 mg by mouth daily.    Yes [provider]  citalopram (CELEXA) 40 MG tablet Take 40 mg by mouth daily.   Yes [provider]  docusate sodium (COLACE) 50 MG capsule Take 50 mg by mouth daily as needed for mild constipation.   Yes [provider]  fluticasone (FLONASE) 50 MCG/ACT nasal spray Place 2 sprays into both nostrils as needed for allergies or rhinitis.   Yes [provider]  isosorbide mononitrate (IMDUR) 60 MG 24 hr tablet Take 60 mg by mouth daily.   Yes [provider]  lactulose (CHRONULAC) 10 GM/15ML solution Take 30 g by mouth daily as needed for mild constipation or moderate constipation.  11/20/17  Yes [provider]  linaclotide (LINZESS) 145 MCG CAPS capsule Take 145 mcg by mouth as needed (constipation).   Yes [provider]  multivitamin [facility]) per tablet Take 1 tablet by mouth daily.     Yes [provider]  nitroGLYCERIN (NITROSTAT) 0.4 MG SL tablet Place 0.4 mg under the tongue every 5 (five) minutes as needed.      Yes [provider]  Olopatadine HCl (PATADAY) 0.2 % SOLN Place 1 drop into both eyes as needed (dry eye).    Yes [provider]  omeprazole (PRILOSEC) 40 MG capsule Take 40 mg by mouth 2 (two) times daily.    Yes [provider]  Oxycodone HCl 10 MG TABS Take 10 mg by mouth every 8 (eight) hours as needed (pain). for pain 01/16/18  Yes [provider]  ranolazine (RANEXA) 500 MG 12 hr tablet Take 500 mg by mouth 2 (two) times daily.   Yes [provider]  tamsulosin (FLOMAX) 0.4 MG CAPS capsule Take 0.4 mg by mouth at bedtime.   Yes [provider]  testosterone cypionate (DEPOTESTOSTERONE CYPIONATE) 200 MG/ML injection Inject 200 mg into the muscle every 14 (fourteen) days.  01/16/18  Yes [provider]    Family History Family History  Problem Relation Age of Onset   Allergies Mother    Hypertension Mother    Rheum arthritis Mother  Social History Social History   Tobacco Use   Smoking status: Former Smoker    Years: 10.00    Types: Cigars    Quit date: 03/25/1988    Years since quitting: 30.6   Smokeless tobacco: Former Systems developer    Types: Chew   Tobacco comment: Smoked 6 cigars a day  Substance Use Topics   Alcohol use: Yes    Comment: occ   Drug use: Not Currently     Allergies   Latex   Review of Systems Review of Systems  Constitutional: Positive for diaphoresis.  Respiratory: Positive for shortness of breath.   Cardiovascular: Positive for chest pain.  Gastrointestinal: Negative for nausea.  All other systems reviewed and are negative.    Physical Exam Updated Vital Signs BP (!) 149/90    Pulse 74    Temp 98.5 F (36.9 C) (Oral)    Resp 12    Ht _0  (1.702 m)    Wt 77.1 kg    SpO2 97%    BMI 26.63 kg/m   Physical Exam Vitals signs and nursing note reviewed.  Constitutional:      General: He is not in acute distress.    Appearance: He is well-developed.  HENT:     Head:  Normocephalic and atraumatic.  Eyes:     Conjunctiva/sclera: Conjunctivae normal.  Cardiovascular:     Rate and Rhythm: Normal rate and regular rhythm.     Comments: 2+ pedal edema BLE Pulmonary:     Effort: Pulmonary effort is normal. No respiratory distress.     Breath sounds: Normal breath sounds.  Chest:     Chest wall: Tenderness present.  Abdominal:     General: Bowel sounds are normal.     Palpations: Abdomen is soft.     Tenderness: There is no abdominal tenderness. There is no guarding or rebound.  Skin:    General: Skin is warm.     Findings: Bruising: left upper anterior chest wall tenderness.     Comments: Psoriatic skin changes BLE.  Neurological:     Mental Status: He is alert.  Psychiatric:        Behavior: Behavior normal.      ED Treatments / Results  Labs (all labs ordered are listed, but only abnormal results are displayed) Labs Reviewed  BASIC METABOLIC PANEL - Abnormal; Notable for the following components:      Result Value   Glucose, Bld 106 (*)    BUN 7 (*)    Calcium 8.5 (*)    All other components within normal limits  CBC WITH DIFFERENTIAL/PLATELET - Abnormal; Notable for the following components:   RDW 18.2 (*)    Platelets 95 (*)    All other components within normal limits  TROPONIN I (HIGH SENSITIVITY) - Abnormal; Notable for the following components:   Troponin I (High Sensitivity) 187 (*)    All other components within normal limits  SARS CORONAVIRUS 2 (HOSPITAL ORDER, [facility])  HIV ANTIBODY (ROUTINE TESTING W REFLEX)  BRAIN NATRIURETIC PEPTIDE  TROPONIN I (HIGH SENSITIVITY)    EKG EKG Interpretation  Date/Time:  Saturday November 21 2018 12:45:53 EDT Ventricular Rate:  76 PR Interval:    QRS Duration: 101 QT Interval:  428 QTC Calculation: 482 R Axis:   1 Text Interpretation:  Sinus rhythm Borderline prolonged QT interval since last tracing no significant change Confirmed by Malvin Johns  [phone]) on 11/21/2018 12:54:21 PM   Radiology Dg Chest  2 View  Result Date: 11/21/2018 CLINICAL DATA:  Chest pain. EXAM: CHEST - 2 VIEW COMPARISON:  None. FINDINGS: Healed left rib fractures. No pneumothorax. No nodules or masses. No focal infiltrates. The cardiomediastinal silhouette is unremarkable other than borderline size to the heart. No focal infiltrate, nodule, or mass. IMPRESSION: No active cardiopulmonary disease. Electronically Signed   By: Dorise Bullion III M.D   On: 11/21/2018 13:21    Procedures .Critical Care Performed by: Pho[place]ia Pirie, [place] N, PA-C Authorized by: Latrese Carolan, [place] N, PA-C   Critical care provider statement:    Critical care time (minutes):  45   Critical care time was exclusive of:  Separately billable procedures and treating other patients and teaching time   Critical care was necessary to treat or prevent imminent or life-threatening deterioration of the following conditions:  Cardiac failure   Critical care was time spent personally by me on the following activities:  Discussions with consultants, evaluation of patient's response to treatment, examination of patient, ordering and performing treatments and interventions, ordering and review of laboratory studies, ordering and review of radiographic studies, pulse oximetry, re-evaluation of patient's condition, obtaining history from patient or surrogate and review of old charts   I assumed direction of critical care for this patient from another provider in my specialty: no     (including critical care time)  Medications Ordered in ED Medications  aspirin chewable tablet 324 mg (has no administration in time range)    Or  aspirin suppository 300 mg (has no administration in time range)  aspirin EC tablet 81 mg (has no administration in time range)  nitroGLYCERIN (NITROSTAT) SL tablet 0.4 mg (has no administration in time range)  acetaminophen (TYLENOL) tablet 650 mg (has no administration in time range)    ondansetron (ZOFRAN) injection 4 mg (has no administration in time range)  atorvastatin (LIPITOR) tablet 80 mg (has no administration in time range)  morphine 2 MG/ML injection 2 mg (has no administration in time range)  aspirin chewable tablet 324 mg (324 mg Oral Given 11/21/18 1407)     Initial Impression / Assessment and Plan / ED Course  I have reviewed the triage vital signs and the nursing notes.  Pertinent labs & imaging results that were available during my care of the patient were reviewed by me and considered in my medical decision making (see chart for details).  Clinical Course as of Nov 21 1555  Sat Nov 21, 2018  1544 Dr. Oval Linsey with cardiology evaluated pt in the ED and will admit to cardiology service for further management.   [JR]    Clinical Course User Index [JR] Soma Lizak, [place] N, PA-C       Patient with known history of CAD and multiple stents, presenting with sudden onset of chest pain this morning that was improved with nitroglycerin.  He is followed by Dr. Bettina Gavia cardiology.  His initial EKG shows no new ischemic changes, however his troponin is acutely elevated at 187. He was treated with SL ntg with some gradual improvement in symptoms. Consulted cardiology, Dr. Oval Linsey, who evaluated pt in the ED.  He will be admitted to the Cardiology service for NSTEMI.  Appreciate consult.   Pt discussed with Dr. Tamera Punt.  The patient appears reasonably stabilized for admission considering the current resources, flow, and capabilities available in the ED at this time, and I doubt any other [facility] requiring further screening and/or treatment in the ED prior to admission.  Final Clinical Impressions(s) / ED Diagnoses  Final diagnoses:  NSTEMI (non-ST elevated myocardial infarction) [facility])    ED Discharge Orders    None       Jandiel Magallanes, [place] N, PA-C 11/21/18 1558    Malvin Johns, MD 11/21/18 [phone]

## 2018-11-21 NOTE — H&P (Signed)
Cardiology Admission History and Physical:   Patient ID: Gary Diaz MRN: [id number]; DOB: May 14, 1955   Admission date: 11/21/2018  Primary Care Provider: Rogene Houston, MD Primary Cardiologist: Shirlee More, MD  Primary Electrophysiologist:  None   Chief Complaint:  Chest pain  Patient Profile:   Gary Diaz is a 63 y.o. male with CAD, CVA, prior GI bleed, hypertension, and hyperlipidemia here with NSTEMI.  History of Present Illness:   Mr. Gary Diaz developed 10/10 chest pressure on the morning of 8/29 soon after getting out of bed.  It was associated with shortness of breath and diaphoresis but no nausea.  He tried taking nitroglycerin without relief.  Symptoms worsened when he tried to walk.  He has a remote history of MI with LAD PCI (patient is unaware of when this occurred).  This discomfort was more severe than his prior MI.  There was no associated lightheadedness, dizziness, or palpitations.  For the last week he also reports lower extremity edema, which is uncommon for him.  His last cath in 2017 showed mild, non-obstructive disease.  He had a stress echo 10/2017 that showed normal systolic function and no ischemia.  He achieved 7 METS.    In the ED Mr. Gary Diaz's EKG showed sinus rhythm without acute changes.  Initial high-sensitivity troponin was 187 with an increase to 780.  He remained hemodynamically stable.  Therefore cardiology was consulted.  At this time he reports 5 out of 10 chest pain and a headache.  Heart Pathway Score:     Past Medical History:  Diagnosis Date  . Acute blood loss anemia 06/12/2015  . Acute lower GI bleeding 06/12/2015  . Bronchiectasis with acute exacerbation ([place]) 11/17/2012  . Bronchiectasis without acute exacerbation ([place]) 06/21/2010   Ct chest 2011:  bilat lower lobe bronchiectasis with tiny nodular infiltrates scattered in the area (?MAC colonization) Spirometry 2012:  No airflow obstruction Spirometry 2014:  Normal.  [name] 2016:   Normal flows.    . Cervicalgia 10/21/2017  . Cholelithiasis 05/29/2015  . Chronic midline low back pain without sciatica 10/21/2017  . Coronary artery disease involving native coronary artery with angina pectoris ([place]) 11/14/2014   Overview:  1.Taxus Stent to the LAD 06/30/07 with patent previous LAD stents  2. Cath 05/21/2011 without restenosis or obstructive stenosis 3. Cath 03/04/13 with patent stent, EF 55-60%.  . CVA (cerebral vascular accident) ([place])   . Diverticulitis of rectosigmoid 05/29/2015  . Essential hypertension 05/14/2010   Qualifier: Diagnosis of  By: Charma Igo    . GERD (gastroesophageal reflux disease)    s/p esophageal dilation for stricture  . 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 Hyperlipidemia   . Hypertension   . Hypokalemia 06/12/2015  . Hypophosphatemia 06/12/2015  . Impingement syndrome of right shoulder 05/09/2016  . Leukocytosis 06/12/2015  . Mixed hyperlipidemia 02/29/2016   Overview:  Added automatically from request for surgery [id number]  . MYOCARDIAL INFARCTION 05/14/2010   Qualifier: History of  By: Charma Igo    . Myocardial infarction (Gary Diaz)    +Sees [facility]. +CAD -recent nuclear scan with EF 40%, ?ischemia  . Postoperative ileus ([place]) 06/05/2015  . TIA (transient ischemic attack) 05/14/2010   Qualifier: Diagnosis of  By: Charma Igo      Past Surgical History:  Procedure Laterality Date  . CORONARY STENT PLACEMENT     x 3  . ROTATOR CUFF REPAIR     right  . TOTAL HIP ARTHROPLASTY     x  2     Medications Prior to Admission: Prior to Admission medications   Medication Sig Start Date End Date Taking? Authorizing Provider  albuterol (PROAIR HFA) 108 (90 Base) MCG/ACT inhaler Inhale 2 puffs into the lungs every 4 (four) hours as needed (shortness of breath).    Yes [provider]  atorvastatin (LIPITOR) 40 MG tablet Take 40 mg by mouth daily.    Yes [provider]  carvedilol (COREG) 12.5 MG tablet Take 12.5 mg by  mouth 2 (two) times daily with a meal.   Yes [provider]  cetirizine (ZYRTEC) 10 MG tablet Take 10 mg by mouth daily.    Yes [provider]  citalopram (CELEXA) 40 MG tablet Take 40 mg by mouth daily.   Yes [provider]  docusate sodium (COLACE) 50 MG capsule Take 50 mg by mouth daily as needed for mild constipation.   Yes [provider]  fluticasone (FLONASE) 50 MCG/ACT nasal spray Place 2 sprays into both nostrils as needed for allergies or rhinitis.   Yes [provider]  isosorbide mononitrate (IMDUR) 60 MG 24 hr tablet Take 60 mg by mouth daily.   Yes [provider]  lactulose (CHRONULAC) 10 GM/15ML solution Take 30 g by mouth daily as needed for mild constipation or moderate constipation.  11/20/17  Yes [provider]  linaclotide (LINZESS) 145 MCG CAPS capsule Take 145 mcg by mouth as needed (constipation).   Yes [provider]  multivitamin [facility]) per tablet Take 1 tablet by mouth daily.     Yes [provider]  nitroGLYCERIN (NITROSTAT) 0.4 MG SL tablet Place 0.4 mg under the tongue every 5 (five) minutes as needed.     Yes [provider]  Olopatadine HCl (PATADAY) 0.2 % SOLN Place 1 drop into both eyes as needed (dry eye).    Yes [provider]  omeprazole (PRILOSEC) 40 MG capsule Take 40 mg by mouth 2 (two) times daily.    Yes [provider]  Oxycodone HCl 10 MG TABS Take 10 mg by mouth every 8 (eight) hours as needed (pain). for pain 01/16/18  Yes [provider]  ranolazine (RANEXA) 500 MG 12 hr tablet Take 500 mg by mouth 2 (two) times daily.   Yes [provider]  tamsulosin (FLOMAX) 0.4 MG CAPS capsule Take 0.4 mg by mouth at bedtime.   Yes [provider]  testosterone cypionate (DEPOTESTOSTERONE CYPIONATE) 200 MG/ML injection Inject 200 mg into the muscle every 14 (fourteen) days.  01/16/18  Yes [provider]      Allergies:    Allergies  Allergen Reactions  . Latex     REACTION: blisters REACTION: blisters    Social History:   Social History   Socioeconomic History  . Marital status: Divorced    Spouse name: Not on file  . Number of children: Not on file  . Years of education: Not on file  . Highest education level: Not on file  Occupational History  . Occupation: disabled    Comment: Dealer  Social Needs  . Financial resource strain: Not on file  . Food insecurity    Worry: Not on file    Inability: Not on file  . Transportation needs    Medical: Not on file    Non-medical: Not on file  Tobacco Use  . Smoking status: Former Smoker    Years: 10.00    Types: Cigars    Quit date: 03/25/1988  Years since quitting: 30.6  . Smokeless tobacco: Former Systems developer    Types: Chew  . Tobacco comment: Smoked 6 cigars a day  Substance and Sexual Activity  . Alcohol use: Yes    Comment: occ  . Drug use: Not Currently  . Sexual activity: Not on file  Lifestyle  . Physical activity    Days per week: Not on file    Minutes per session: Not on file  . Stress: Not on file  Relationships  . Social Herbalist on phone: Not on file    Gets together: Not on file    Attends religious service: Not on file    Active member of club or organization: Not on file    Attends meetings of clubs or organizations: Not on file    Relationship status: Not on file  . Intimate partner violence    Fear of current or ex partner: Not on file    Emotionally abused: Not on file    Physically abused: Not on file    Forced sexual activity: Not on file  Other Topics Concern  . Not on file  Social History Narrative  . Not on file    Family History:   The patient's family history includes Allergies in his mother; Hypertension in his mother; Rheum arthritis in his mother.   No family history of heart disease.  ROS:  Please see the history of present illness.  All other ROS reviewed and negative.      Physical Exam/Data:   Vitals:   11/21/18 1405 11/21/18 1415 11/21/18 1430 11/21/18 1431  BP: 140/72 132/77 133/81   Pulse: 69 76 71   Resp: _0 Temp:      TempSrc:      SpO2: 95% 94% 96%   Weight:    77.1 kg  Height:    _1  (1.702 m)    Intake/Output Summary (Last 24 hours) at 11/21/2018 1500 Last data filed at 11/21/2018 1247 Gross per 24 hour  Intake 700 ml  Output -  Net 700 ml   Last 3 Weights 11/21/2018 01/30/2018 11/18/2017  Weight (lbs) 170 lb 182 lb 181 lb  Weight (kg) 77.111 kg 82.555 kg 82.101 kg     VS:  BP (!) 154/85   Pulse 72   Temp 98.5 F (36.9 C) (Oral)   Resp 12   Ht _2  (1.702 m)   Wt 77.1 kg   SpO2 97%   BMI 26.63 kg/m  , BMI Body mass index is 26.63 kg/m. GENERAL:  Well appearing HEENT: Pupils equal round and reactive, fundi not visualized, oral mucosa unremarkable NECK:  No jugular venous distention, waveform within normal limits, carotid upstroke brisk and symmetric, no bruits, no thyromegaly LYMPHATICS:  No cervical adenopathy LUNGS:  Clear to auscultation bilaterally HEART:  RRR.  PMI not displaced or sustained,S1 and S2 within normal limits, no S3, no S4, no clicks, no rubs, no murmurs ABD:  Flat, positive bowel sounds normal in frequency in pitch, no bruits, no rebound, no guarding, no midline pulsatile mass, no hepatomegaly, no splenomegaly EXT:  2 plus pulses throughout, no edema, no cyanosis no clubbing SKIN:  No rashes no nodules NEURO:  Cranial nerves II through XII grossly intact, motor grossly intact throughout PSYCH:  Cognitively intact, oriented to person place and time   EKG:  The ECG that was done 11/21/18 was personally reviewed and demonstrates sinus rhythm.  Rate 76 bpm.  Relevant CV Studies:  Stress echo 08/3891: Normal systolic function.  No ischemia.  7 METS.   Laboratory Data:  High Sensitivity Troponin:   Recent Labs  Lab 11/21/18 1300  TROPONINIHS 187*      Chemistry Recent Labs  Lab 11/21/18 1300   NA 142  K 3.9  CL 105  CO2 29  GLUCOSE 106*  BUN 7*  CREATININE 0.84  CALCIUM 8.5*  GFRNONAA >60  GFRAA >60  ANIONGAP 8    No results for input(s): PROT, ALBUMIN, AST, ALT, ALKPHOS, BILITOT in the last 168 hours. Hematology Recent Labs  Lab 11/21/18 1300  WBC 5.8  RBC 4.76  HGB 14.9  HCT 44.2  MCV 92.9  MCH 31.3  MCHC 33.7  RDW 18.2*  PLT 95*   BNPNo results for input(s): BNP, PROBNP in the last 168 hours.  DDimer No results for input(s): DDIMER in the last 168 hours.  08/26/2017: Total cholesterol 163, triglycerides 251, HDL 42, LDL 88  Radiology/Studies:  Dg Chest 2 View  Result Date: 11/21/2018 CLINICAL DATA:  Chest pain. EXAM: CHEST - 2 VIEW COMPARISON:  None. FINDINGS: Healed left rib fractures. No pneumothorax. No nodules or masses. No focal infiltrates. The cardiomediastinal silhouette is unremarkable other than borderline size to the heart. No focal infiltrate, nodule, or mass. IMPRESSION: No active cardiopulmonary disease. Electronically Signed   By: Dorise Bullion III M.D   On: 11/21/2018 13:21    Assessment and Plan:   # NSTEMI:  # Hyperlipidemia: Mr. Mclean has a history of remote PCI.  Last cath in 2017 reportedly showed nonobstructive CAD.  He presents today with chest pain and elevated troponin consistent with NSTEMI.  We will plan to start heparin and morphine.  He will go for cardiac catheterization on Monday.  LDL was 88 08/2017.  Increase atorvastatin to 80 mg.  He will need repeat lipids and a CMP in 6 to 8 weeks.  Continue home aspirin, carvedilol, Imdur, and ranolazine.  Echo pending.  # Hypertension: Continue carvedilol and Imdur.   Severity of Illness: The appropriate patient status for this patient is INPATIENT. Inpatient status is judged to be reasonable and necessary in order to provide the required intensity of service to ensure the patient's safety. The patient's presenting symptoms, physical exam findings, and initial radiographic and  laboratory data in the context of their chronic comorbidities is felt to place them at high risk for further clinical deterioration. Furthermore, it is not anticipated that the patient will be medically stable for discharge from the hospital within 2 midnights of admission. The following factors support the patient status of inpatient.   " The patient's presenting symptoms include chest pain. " The worrisome physical exam findings include n/a. " The initial radiographic and laboratory data are worrisome because of NSTEMI. " The chronic co-morbidities include cad, hypertension, hyperlipdemia.   * I certify that at the point of admission it is my clinical judgment that the patient will require inpatient hospital care spanning beyond 2 midnights from the point of admission due to high intensity of service, high risk for further deterioration and high frequency of surveillance required.*    For questions or updates, please contact [place] Please consult [email or web address] for contact info under        Signed, Skeet Latch, MD  11/21/2018 3:00 PM

## 2018-11-21 NOTE — Progress Notes (Signed)
[place] for heparin Indication: chest pain/ACS  Heparin Dosing Weight: 77.1 kg  Labs: Recent Labs    11/21/18 1300  HGB 14.9  HCT 44.2  PLT 95*  CREATININE 0.84    Assessment: 64 yom presenting with CP. Pharmacy consulted to dose heparin for NSTEMI. Noted hx of GIB in 2017. Not on anticoagulation PTA. Hg wnl, plt 95, high sensitivity troponin 187. No current active bleed issues documented.  Goal of Therapy:  Heparin level 0.3-0.7 units/ml Monitor platelets by anticoagulation protocol: Yes   Plan:  Heparin 4000 unit bolus Start heparin at 950 units/h 6h heparin level Daily heparin level/CBC Monitor s/sx bleeding  Elicia Lamp, PharmD, BCPS Clinical Pharmacist 11/21/2018 3:58 PM

## 2018-11-21 NOTE — ED Notes (Signed)
Pt sister Talmadge Coventry [phone]

## 2018-11-21 NOTE — ED Triage Notes (Signed)
[facility] reports that the patient had a 10/10 chest pressure today. He took 2 nitros at home and the pain decreased to a 7/10. EMS gave him 8 more nitro and his pain decreased to a 5/10. Reports he denies N/V, and no radiation. He was given no aspirin because the patient said his doc told him he could not take it. Has a hx of MI.

## 2018-11-22 ENCOUNTER — Encounter (HOSPITAL_COMMUNITY): Payer: Self-pay

## 2018-11-22 ENCOUNTER — Inpatient Hospital Stay (HOSPITAL_COMMUNITY): Payer: Medicare Other

## 2018-11-22 DIAGNOSIS — I5041 Acute combined systolic (congestive) and diastolic (congestive) heart failure: Secondary | ICD-10-CM

## 2018-11-22 DIAGNOSIS — R0789 Other chest pain: Secondary | ICD-10-CM

## 2018-11-22 LAB — HIV ANTIBODY (ROUTINE TESTING W REFLEX): HIV Screen 4th Generation wRfx: NONREACTIVE

## 2018-11-22 LAB — CBC
HCT: 44.9 % (ref 39.0–52.0)
Hemoglobin: 15.2 g/dL (ref 13.0–17.0)
MCH: 31.1 pg (ref 26.0–34.0)
MCHC: 33.9 g/dL (ref 30.0–36.0)
MCV: 92 fL (ref 80.0–100.0)
Platelets: 87 10*3/uL — ABNORMAL LOW (ref 150–400)
RBC: 4.88 MIL/uL (ref 4.22–5.81)
RDW: 18.4 % — ABNORMAL HIGH (ref 11.5–15.5)
WBC: 8.2 10*3/uL (ref 4.0–10.5)
nRBC: 0 % (ref 0.0–0.2)

## 2018-11-22 LAB — ECHOCARDIOGRAM COMPLETE
Height: 67 in
Weight: 2856 oz

## 2018-11-22 LAB — BASIC METABOLIC PANEL
Anion gap: 11 (ref 5–15)
BUN: 9 mg/dL (ref 8–23)
CO2: 25 mmol/L (ref 22–32)
Calcium: 8.7 mg/dL — ABNORMAL LOW (ref 8.9–10.3)
Chloride: 101 mmol/L (ref 98–111)
Creatinine, Ser: 0.81 mg/dL (ref 0.61–1.24)
GFR calc Af Amer: >60 mL/min (ref >60)
GFR calc non Af Amer: >60 mL/min (ref >60)
Glucose, Bld: 103 mg/dL — ABNORMAL HIGH (ref 70–99)
Potassium: 3.8 mmol/L (ref 3.5–5.1)
Sodium: 137 mmol/L (ref 135–145)

## 2018-11-22 LAB — LIPID PANEL
Cholesterol: 141 mg/dL (ref 0–200)
HDL: 38 mg/dL — ABNORMAL LOW (ref >40)
LDL Cholesterol: 75 mg/dL (ref 0–99)
Total CHOL/HDL Ratio: 3.7 RATIO
Triglycerides: 138 mg/dL (ref <150)
VLDL: 28 mg/dL (ref 0–40)

## 2018-11-22 LAB — HEPARIN LEVEL (UNFRACTIONATED)
Heparin Unfractionated: 0.33 IU/mL (ref 0.30–0.70)
Heparin Unfractionated: 0.28 IU/mL — ABNORMAL LOW (ref 0.30–0.70)

## 2018-11-22 MED ORDER — NITROGLYCERIN IN D5W 200-5 MCG/ML-% IV SOLN
2.0000 ug/min | INTRAVENOUS | Status: DC
  Administered 2018-11-22: 02:00:00 10 ug/min via INTRAVENOUS
  Administered 2018-11-23: 40 ug/min via INTRAVENOUS
  Filled 2018-11-22 (×2): qty 250

## 2018-11-22 MED ORDER — NITROGLYCERIN IN D5W 200-5 MCG/ML-% IV SOLN
INTRAVENOUS | Status: AC
  Filled 2018-11-22: qty 250

## 2018-11-22 NOTE — Progress Notes (Signed)
[place] for Heparin Indication: chest pain/ACS  Allergies  Allergen Reactions  . Latex     REACTION: blisters REACTION: blisters    Patient Measurements: Height: _0  (170.2 cm) Weight: 178 lb 8 oz (81 kg) IBW/kg (Calculated) : 66.1 Heparin Dosing Weight: 77.1 kg  Vital Signs: Temp: 98 F (36.7 C) (08/30 0833) Temp Source: Oral (08/30 0833) BP: 135/82 (08/30 1100) Pulse Rate: 69 (08/30 0854)  Labs: Recent Labs    11/21/18 1300 11/21/18 1445 11/22/18 0347 11/22/18 1135  HGB 14.9  --  15.2  --   HCT 44.2  --  44.9  --   PLT 95*  --  87*  --   HEPARINUNFRC  --   --  0.33 0.28*  CREATININE 0.84  --  0.81  --   TROPONINIHS 187* 780*  --   --     Estimated Creatinine Clearance: 92.7 mL/min (by C-G formula based on SCr of 0.81 mg/dL).   Medical History: Past Medical History:  Diagnosis Date  . Acute blood loss anemia 06/12/2015  . Acute lower GI bleeding 06/12/2015  . Bronchiectasis with acute exacerbation ([place]) 11/17/2012  . Bronchiectasis without acute exacerbation ([place]) 06/21/2010   Ct chest 2011:  bilat lower lobe bronchiectasis with tiny nodular infiltrates scattered in the area (?MAC colonization) Spirometry 2012:  No airflow obstruction Spirometry 2014:  Normal.  [name] 2016:  Normal flows.    . Cervicalgia 10/21/2017  . Cholelithiasis 05/29/2015  . Chronic midline low back pain without sciatica 10/21/2017  . Coronary artery disease involving native coronary artery with angina pectoris ([place]) 11/14/2014   Overview:  1.Taxus Stent to the LAD 06/30/07 with patent previous LAD stents  2. Cath 05/21/2011 without restenosis or obstructive stenosis 3. Cath 03/04/13 with patent stent, EF 55-60%.  . CVA (cerebral vascular accident) ([place])   . Diverticulitis of rectosigmoid 05/29/2015  . Essential hypertension 05/14/2010   Qualifier: Diagnosis of  By: Charma Igo    . GERD (gastroesophageal reflux disease)    s/p esophageal dilation for  stricture  . 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 Hyperlipidemia   . Hypertension   . Hypokalemia 06/12/2015  . Hypophosphatemia 06/12/2015  . Impingement syndrome of right shoulder 05/09/2016  . Leukocytosis 06/12/2015  . Mixed hyperlipidemia 02/29/2016   Overview:  Added automatically from request for surgery [id number]  . MYOCARDIAL INFARCTION 05/14/2010   Qualifier: History of  By: Charma Igo    . Myocardial infarction ([place])    +Sees [facility]. +CAD -recent nuclear scan with EF 40%, ?ischemia  . Postoperative ileus ([place]) 06/05/2015  . TIA (transient ischemic attack) 05/14/2010   Qualifier: Diagnosis of  By: Charma Igo      Medications:  Infusions:  . heparin 950 Units/hr (11/22/18 0700)  . nitroGLYCERIN    . nitroGLYCERIN 30 mcg/min (11/22/18 1156)    Assessment: 55 yom presenting with CP. Pharmacy consulted to dose heparin for NSTEMI. Noted hx of GIB in 2017. Not on anticoagulation PTA. Hg wnl, plt 95, high sensitivity troponin 187. No current active bleed issues documented.  Heparin level slightly subtherapeutic at 0.28 on drip rate of 950 units/hr. Hgb wnl at 15.2. Plts low at 87 from 95 yesterday. No overt bleeding or infusion issues per RN.   Goal of Therapy:  Heparin level 0.3-0.7 units/ml Monitor platelets by anticoagulation protocol: Yes   Plan:  - Increase heparin to 1100 units/hr - 6-hour heparin level - Daily HL and CBC -  Monitor for signs of bleeding  Richardine Service, PharmD PGY1 Pharmacy Resident Phone: [phone] 11/22/2018  12:15 PM  Please check [email or web address] for unit-specific pharmacy phone numbers.

## 2018-11-22 NOTE — Progress Notes (Signed)
Pt c/o sharp chest pain 8/10. V/S stable. EKG done. Morphine 2mg  IV given x 1 dose. Ntg SL given x 3 doses decreased the pain to 4/10 (dull pain). Dr Vickki Muff informed. Ntg gtt started as ordered & titrated. Will continue to monitor pt.

## 2018-11-22 NOTE — Progress Notes (Signed)
  Echocardiogram 2D Echocardiogram has been performed.  Johny Chess 11/22/2018, 10:29 AM

## 2018-11-22 NOTE — Progress Notes (Signed)
Progress Note  Patient Name: Gary Diaz Date of Encounter: 11/22/2018  Primary Cardiologist: Gary More, MD   Subjective   Feeling well.  He had some chest discomfort while getting his echo but is otherwise well.   Inpatient Medications    Scheduled Meds: . aspirin EC  81 mg Oral Daily  . atorvastatin  80 mg Oral q1800  . carvedilol  12.5 mg Oral BID WC  . citalopram  40 mg Oral Daily  . loratadine  10 mg Oral Daily  . olopatadine  1 drop Both Eyes BID  . pantoprazole  40 mg Oral Daily  . ranolazine  500 mg Oral BID  . tamsulosin  0.4 mg Oral QHS   Continuous Infusions: . heparin 1,100 Units/hr (11/22/18 1222)  . nitroGLYCERIN    . nitroGLYCERIN 40 mcg/min (11/22/18 1232)   PRN Meds: acetaminophen, albuterol, docusate sodium, fluticasone, morphine injection, nitroGLYCERIN, ondansetron (ZOFRAN) IV, oxyCODONE   Vital Signs    Vitals:   11/22/18 0833 11/22/18 0836 11/22/18 0854 11/22/18 1100  BP: (!) 142/79  (!) 155/84 135/82  Pulse: 80 78 69   Resp:      Temp: 98 F (36.7 C)     TempSrc: Oral     SpO2: 97% 98%    Weight:      Height:        Intake/Output Summary (Last 24 hours) at 11/22/2018 1314 Last data filed at 11/22/2018 0700 Gross per 24 hour  Intake 430.74 ml  Output -  Net 430.74 ml   Last 3 Weights 11/22/2018 11/21/2018 11/21/2018  Weight (lbs) 178 lb 8 oz 179 lb 8 oz 170 lb  Weight (kg) 80.967 kg 81.421 kg 77.111 kg      Telemetry    Sinus rhythm.  PVCs - Personally Reviewed  ECG    11/22/18: Sinus rhythm.  Rate 66 bpm.  - Personally Reviewed  Physical Exam   VS:  BP 135/82 (BP Location: Right Arm)   Pulse 69   Temp 98 F (36.7 C) (Oral)   Resp 18   Ht 5\' 7"  (1.702 m)   Wt 81 kg   SpO2 98%   BMI 27.96 kg/m  , BMI Body mass index is 27.96 kg/m. GENERAL:  Well appearing HEENT: Pupils equal round and reactive, fundi not visualized, oral mucosa unremarkable NECK:  No jugular venous distention, waveform within normal limits,  carotid upstroke brisk and symmetric, no bruits, no thyromegaly LYMPHATICS:  No cervical adenopathy LUNGS:  Clear to auscultation bilaterally HEART:  RRR.  PMI not displaced or sustained,S1 and S2 within normal limits, no S3, no S4, no clicks, no rubs, no murmurs ABD:  Flat, positive bowel sounds normal in frequency in pitch, no bruits, no rebound, no guarding, no midline pulsatile mass, no hepatomegaly, no splenomegaly EXT:  2 plus pulses throughout, no edema, no cyanosis no clubbing SKIN:  No rashes no nodules NEURO:  Cranial nerves II through XII grossly intact, motor grossly intact throughout Gary Diaz:  Cognitively intact, oriented to person place and time   Labs    High Sensitivity Troponin:   Recent Labs  Lab 11/21/18 1300 11/21/18 1445  TROPONINIHS 187* 780*      Chemistry Recent Labs  Lab 11/21/18 1300 11/22/18 0347  NA 142 137  K 3.9 3.8  CL 105 101  CO2 29 25  GLUCOSE 106* 103*  BUN 7* 9  CREATININE 0.84 0.81  CALCIUM 8.5* 8.7*  GFRNONAA >60 >60  GFRAA >60 >60  ANIONGAP 8 11     Hematology Recent Labs  Lab 11/21/18 1300 11/22/18 0347  WBC 5.8 8.2  RBC 4.76 4.88  HGB 14.9 15.2  HCT 44.2 44.9  MCV 92.9 92.0  MCH 31.3 31.1  MCHC 33.7 33.9  RDW 18.2* 18.4*  PLT 95* 87*    BNP Recent Labs  Lab 11/21/18 1300  BNP 35.2     DDimer No results for input(s): DDIMER in the last 168 hours.   Radiology    Dg Chest 2 View  Result Date: 11/21/2018 CLINICAL DATA:  Chest pain. EXAM: CHEST - 2 VIEW COMPARISON:  None. FINDINGS: Healed left rib fractures. No pneumothorax. No nodules or masses. No focal infiltrates. The cardiomediastinal silhouette is unremarkable other than borderline size to the heart. No focal infiltrate, nodule, or mass. IMPRESSION: No active cardiopulmonary disease. Electronically Signed   By: Gary  Williams Diaz M.D   On: 11/21/2018 13:21    Cardiac Studies   Echo 11/22/18: IMPRESSIONS   1. The left ventricle has mild-moderately  reduced systolic function, with an ejection fraction of 40-45%. The cavity size was normal. There is mildly increased left ventricular wall thickness. Left ventricular diastolic Doppler parameters are consistent  with impaired relaxation. Indeterminate filling pressures The E/e' is 8-15.  2. Severe hypokinesis of the left ventricular, entire inferior wall and lateral wall.  3. The right ventricle has normal systolic function. The cavity was normal. There is no increase in right ventricular wall thickness.  4. The mitral valve is grossly normal.  5. The tricuspid valve is grossly normal.  6. The aortic valve is tricuspid. Mild sclerosis of the aortic valve. Aortic valve regurgitation is trivial by color flow Doppler. No stenosis of the aortic valve.  7. The aorta is normal unless otherwise noted.  Patient Profile      63 y.o. male with CAD, CVA, prior GI bleed, hypertension, and hyperlipidemia here with NSTEMI.  Assessment & Plan    # NSTEMI:  # Hyperlipidemia: [name] has a history of remote PCI.  Last cath in 2017 reportedly showed nonobstructive CAD.  He presents now with chest pain and elevated troponin consistent with NSTEMI.  He is currently chest pain free on heparin and morphine.  He will go for cardiac catheterization on Monday.  LDL 75.  Increased atorvastatin to 80 mg.  He will need repeat lipids and a CMP in 6 to 8 weeks.  Continue home aspirin, carvedilol, and ranolazine.  Home Imdur was switched to NTG drip overnight.  NPO tonight for LHC tomorrow.  Risks and benefits of cardiac catheterization have been discussed with the patient.  The patient understands that risks included but are not limited to stroke (1 in 1000), death (1 in 1000), kidney failure [usually temporary] (1 in 500), bleeding (1 in 200), allergic reaction [possibly serious] (1 in 200). The patient understands and agrees to proceed.   # Acute systolic heart failure: LVEF 40-45% on echo this admission.  He had  normal systolic function on echo 10/2017.  Plan for LHC tomorrow as above.  # Hypertension: Continue carvedilol and Imdur.   For questions or updates, please contact [facility] Please consult [email or web address] for contact info under        Signed, Gary Diaz [place], MD  11/22/2018, 1:14 PM    

## 2018-11-22 NOTE — H&P (View-Only) (Signed)
Progress Note  Patient Name: Gary Diaz Date of Encounter: 11/22/2018  Primary Cardiologist: Shirlee More, MD   Subjective   Feeling well.  He had some chest discomfort while getting his echo but is otherwise well.   Inpatient Medications    Scheduled Meds: . aspirin EC  81 mg Oral Daily  . atorvastatin  80 mg Oral q1800  . carvedilol  12.5 mg Oral BID WC  . citalopram  40 mg Oral Daily  . loratadine  10 mg Oral Daily  . olopatadine  1 drop Both Eyes BID  . pantoprazole  40 mg Oral Daily  . ranolazine  500 mg Oral BID  . tamsulosin  0.4 mg Oral QHS   Continuous Infusions: . heparin 1,100 Units/hr (11/22/18 1222)  . nitroGLYCERIN    . nitroGLYCERIN 40 mcg/min (11/22/18 1232)   PRN Meds: acetaminophen, albuterol, docusate sodium, fluticasone, morphine injection, nitroGLYCERIN, ondansetron (ZOFRAN) IV, oxyCODONE   Vital Signs    Vitals:   11/22/18 0833 11/22/18 0836 11/22/18 0854 11/22/18 1100  BP: (!) 142/79  (!) 155/84 135/82  Pulse: 80 78 69   Resp:      Temp: 98 F (36.7 C)     TempSrc: Oral     SpO2: 97% 98%    Weight:      Height:        Intake/Output Summary (Last 24 hours) at 11/22/2018 1314 Last data filed at 11/22/2018 0700 Gross per 24 hour  Intake 430.74 ml  Output -  Net 430.74 ml   Last 3 Weights 11/22/2018 11/21/2018 11/21/2018  Weight (lbs) 178 lb 8 oz 179 lb 8 oz 170 lb  Weight (kg) 80.967 kg 81.421 kg 77.111 kg      Telemetry    Sinus rhythm.  PVCs - Personally Reviewed  ECG    11/22/18: Sinus rhythm.  Rate 66 bpm.  - Personally Reviewed  Physical Exam   VS:  BP 135/82 (BP Location: Right Arm)   Pulse 69   Temp 98 F (36.7 C) (Oral)   Resp 18   Ht 5\' 7"  (1.702 m)   Wt 81 kg   SpO2 98%   BMI 27.96 kg/m  , BMI Body mass index is 27.96 kg/m. GENERAL:  Well appearing HEENT: Pupils equal round and reactive, fundi not visualized, oral mucosa unremarkable NECK:  No jugular venous distention, waveform within normal limits,  carotid upstroke brisk and symmetric, no bruits, no thyromegaly LYMPHATICS:  No cervical adenopathy LUNGS:  Clear to auscultation bilaterally HEART:  RRR.  PMI not displaced or sustained,S1 and S2 within normal limits, no S3, no S4, no clicks, no rubs, no murmurs ABD:  Flat, positive bowel sounds normal in frequency in pitch, no bruits, no rebound, no guarding, no midline pulsatile mass, no hepatomegaly, no splenomegaly EXT:  2 plus pulses throughout, no edema, no cyanosis no clubbing SKIN:  No rashes no nodules NEURO:  Cranial nerves II through XII grossly intact, motor grossly intact throughout [facility]:  Cognitively intact, oriented to person place and time   Labs    High Sensitivity Troponin:   Recent Labs  Lab 11/21/18 1300 11/21/18 1445  TROPONINIHS 187* 780*      Chemistry Recent Labs  Lab 11/21/18 1300 11/22/18 0347  NA 142 137  K 3.9 3.8  CL 105 101  CO2 29 25  GLUCOSE 106* 103*  BUN 7* 9  CREATININE 0.84 0.81  CALCIUM 8.5* 8.7*  GFRNONAA >60 >60  GFRAA >60 >60  ANIONGAP 8 11     Hematology Recent Labs  Lab 11/21/18 1300 11/22/18 0347  WBC 5.8 8.2  RBC 4.76 4.88  HGB 14.9 15.2  HCT 44.2 44.9  MCV 92.9 92.0  MCH 31.3 31.1  MCHC 33.7 33.9  RDW 18.2* 18.4*  PLT 95* 87*    BNP Recent Labs  Lab 11/21/18 1300  BNP 35.2     DDimer No results for input(s): DDIMER in the last 168 hours.   Radiology    Dg Chest 2 View  Result Date: 11/21/2018 CLINICAL DATA:  Chest pain. EXAM: CHEST - 2 VIEW COMPARISON:  None. FINDINGS: Healed left rib fractures. No pneumothorax. No nodules or masses. No focal infiltrates. The cardiomediastinal silhouette is unremarkable other than borderline size to the heart. No focal infiltrate, nodule, or mass. IMPRESSION: No active cardiopulmonary disease. Electronically Signed   By: Gerome Sam III M.D   On: 11/21/2018 13:21    Cardiac Studies   Echo 11/22/18: IMPRESSIONS   1. The left ventricle has mild-moderately  reduced systolic function, with an ejection fraction of 40-45%. The cavity size was normal. There is mildly increased left ventricular wall thickness. Left ventricular diastolic Doppler parameters are consistent  with impaired relaxation. Indeterminate filling pressures The E/e' is 8-15.  2. Severe hypokinesis of the left ventricular, entire inferior wall and lateral wall.  3. The right ventricle has normal systolic function. The cavity was normal. There is no increase in right ventricular wall thickness.  4. The mitral valve is grossly normal.  5. The tricuspid valve is grossly normal.  6. The aortic valve is tricuspid. Mild sclerosis of the aortic valve. Aortic valve regurgitation is trivial by color flow Doppler. No stenosis of the aortic valve.  7. The aorta is normal unless otherwise noted.  Patient Profile      63 y.o. male with CAD, CVA, prior GI bleed, hypertension, and hyperlipidemia here with NSTEMI.  Assessment & Plan    # NSTEMI:  # Hyperlipidemia: Mr. Stalter has a history of remote PCI.  Last cath in 2017 reportedly showed nonobstructive CAD.  He presents now with chest pain and elevated troponin consistent with NSTEMI.  He is currently chest pain free on heparin and morphine.  He will go for cardiac catheterization on Monday.  LDL 75.  Increased atorvastatin to 80 mg.  He will need repeat lipids and a CMP in 6 to 8 weeks.  Continue home aspirin, carvedilol, and ranolazine.  Home Imdur was switched to NTG drip overnight.  NPO tonight for [facility] tomorrow.  Risks and benefits of cardiac catheterization have been discussed with the patient.  The patient understands that risks included but are not limited to stroke (1 in 1000), death (1 in 1000), kidney failure [usually temporary] (1 in 500), bleeding (1 in 200), allergic reaction [possibly serious] (1 in 200). The patient understands and agrees to proceed.   # Acute systolic heart failure: LVEF 40-45% on echo this admission.  He had  normal systolic function on echo 10/2017.  Plan for [facility] tomorrow as above.  # Hypertension: Continue carvedilol and Imdur.   For questions or updates, please contact [facility] Please consult [email or web address] for contact info under        Signed, Chilton Si, MD  11/22/2018, 1:14 PM

## 2018-11-22 NOTE — Progress Notes (Cosign Needed)
Pt ambulated to clean self. Chest pain currently under control. Patient in good sprits.

## 2018-11-22 NOTE — Progress Notes (Signed)
[place] for Heparin Indication: chest pain/ACS  Heparin Dosing Weight: 77.1 kg  Labs: Recent Labs    11/21/18 1300 11/22/18 0347  HGB 14.9 15.2  HCT 44.2 44.9  PLT 95* 87*  HEPARINUNFRC  --  0.33  CREATININE 0.84 0.81    Assessment: 78 yom presenting with CP. Pharmacy consulted to dose heparin for NSTEMI. Noted hx of GIB in 2017. Not on anticoagulation PTA. Hg wnl, plt 95, high sensitivity troponin 187. No current active bleed issues documented.  8/30 AM update: Initial heparin level therapeutic   Goal of Therapy:  Heparin level 0.3-0.7 units/ml Monitor platelets by anticoagulation protocol: Yes   Plan:  Cont heparin at 950 units/h 6h heparin level to confirm Daily heparin level/CBC Monitor s/sx bleeding  Narda Bonds, PharmD, BCPS Clinical Pharmacist Phone: [phone]

## 2018-11-22 NOTE — Progress Notes (Cosign Needed)
Titrated nitro drip to help decrease chest pain Patient given PRN tylenol for headache

## 2018-11-23 ENCOUNTER — Encounter (HOSPITAL_COMMUNITY)
Admission: EM | Disposition: A | Discharge status: Home / Self Care | Payer: Self-pay | Source: Home / Self Care | Attending: Cardiovascular Disease

## 2018-11-23 ENCOUNTER — Encounter (HOSPITAL_COMMUNITY): Payer: Self-pay | Admitting: Cardiovascular Disease

## 2018-11-23 DIAGNOSIS — R0789 Other chest pain: Secondary | ICD-10-CM

## 2018-11-23 DIAGNOSIS — R7989 Other specified abnormal findings of blood chemistry: Secondary | ICD-10-CM

## 2018-11-23 DIAGNOSIS — R079 Chest pain, unspecified: Secondary | ICD-10-CM

## 2018-11-23 DIAGNOSIS — R778 Other specified abnormalities of plasma proteins: Secondary | ICD-10-CM

## 2018-11-23 HISTORY — PX: LEFT HEART CATH AND CORONARY ANGIOGRAPHY: CATH118249

## 2018-11-23 LAB — CBC
HCT: 48.2 % (ref 39.0–52.0)
Hemoglobin: 16 g/dL (ref 13.0–17.0)
MCH: 30.9 pg (ref 26.0–34.0)
MCHC: 33.2 g/dL (ref 30.0–36.0)
MCV: 93.2 fL (ref 80.0–100.0)
Platelets: 102 10*3/uL — ABNORMAL LOW (ref 150–400)
RBC: 5.17 MIL/uL (ref 4.22–5.81)
RDW: 18.6 % — ABNORMAL HIGH (ref 11.5–15.5)
WBC: 10.4 10*3/uL (ref 4.0–10.5)
nRBC: 0 % (ref 0.0–0.2)

## 2018-11-23 LAB — HEPARIN LEVEL (UNFRACTIONATED): Heparin Unfractionated: 0.55 IU/mL (ref 0.30–0.70)

## 2018-11-23 IMAGING — MR MR CERVICAL SPINE W/O CM
4 of 5 series · 25 of 48 positions shown · non-contrast
Comparison: CT cervical spine dated February 27, 2017. MRI cervical
spine dated June 29, 2005.

CLINICAL DATA: Neck pain radiating into the right shoulder.

EXAM:
MRI CERVICAL SPINE WITHOUT CONTRAST
TECHNIQUE: Multiplanar, multisequence MR imaging of the cervical spine was
performed. No intravenous contrast was administered.

[Series 3: T2 post-contrast · sagittal · 3.3mm · 0.37mm/px · 6 of 13 slices shown]
[im 1/13]
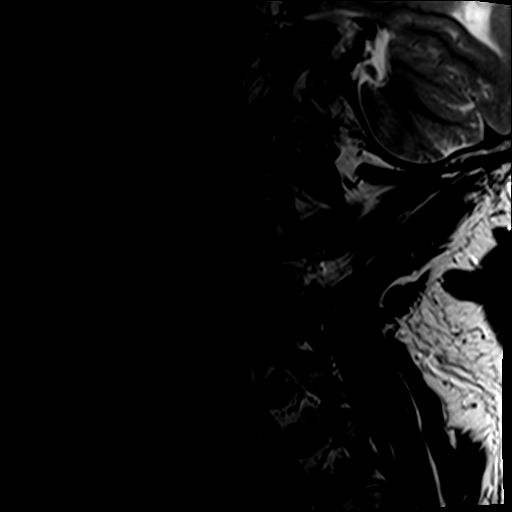
[im 3/13]
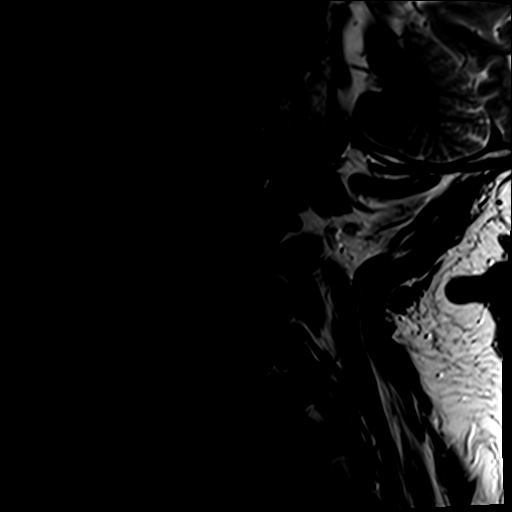
[im 5/13]
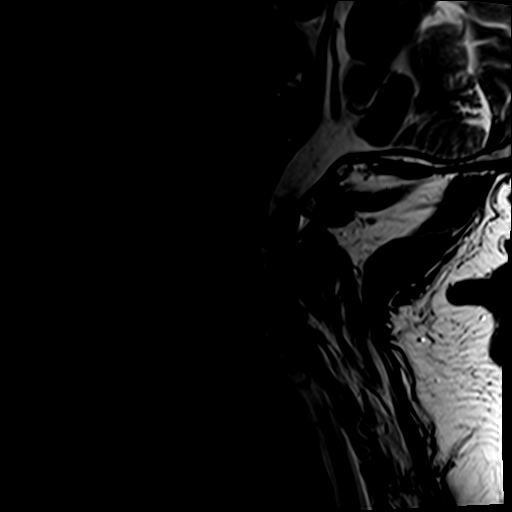
[im 8/13]
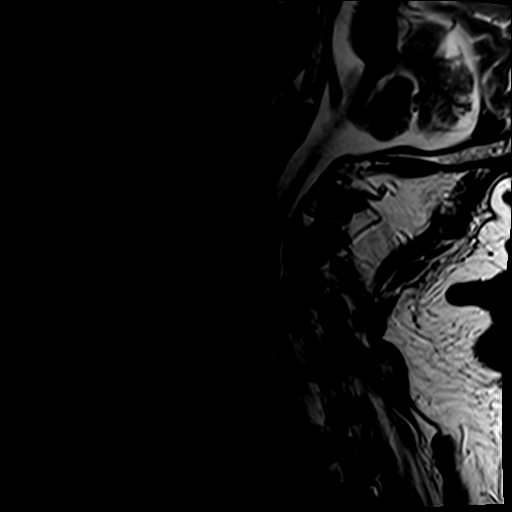
[im 10/13]
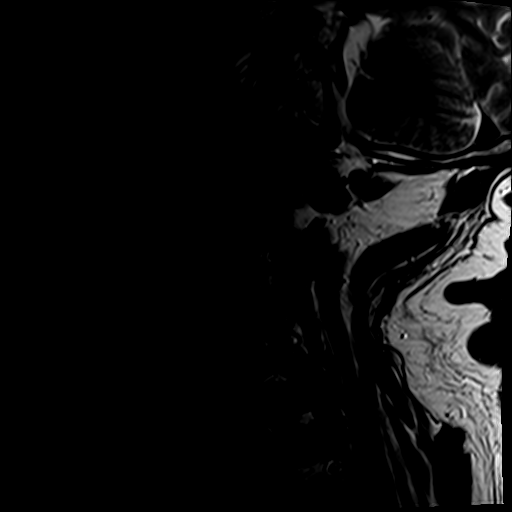
[im 13/13]
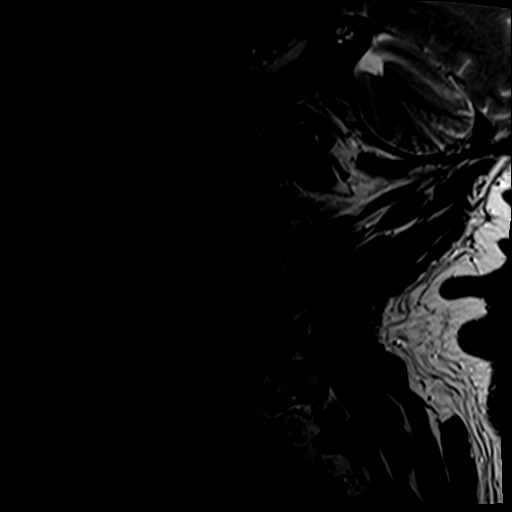

[Series 4: T1 · sagittal · 3.3mm · 0.37mm/px · 6 of 13 slices shown]
[im 1/13]
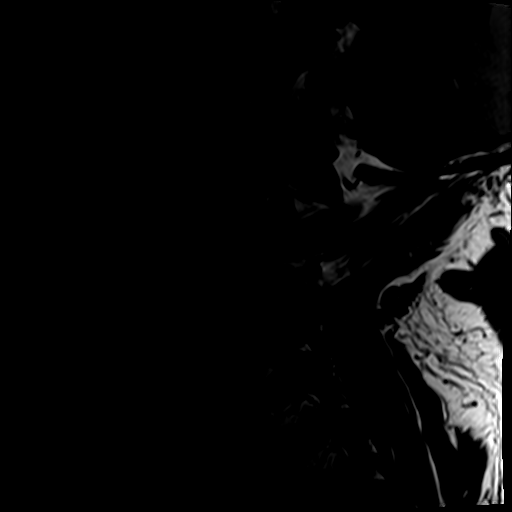
[im 3/13]
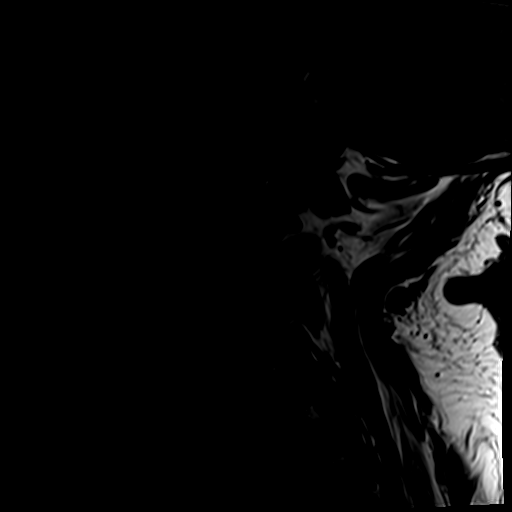
[im 5/13]
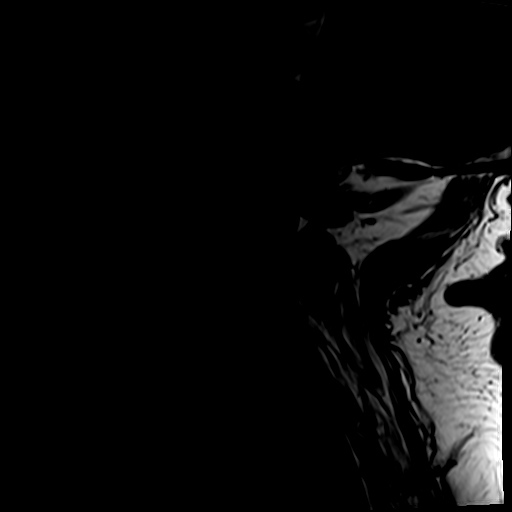
[im 8/13]
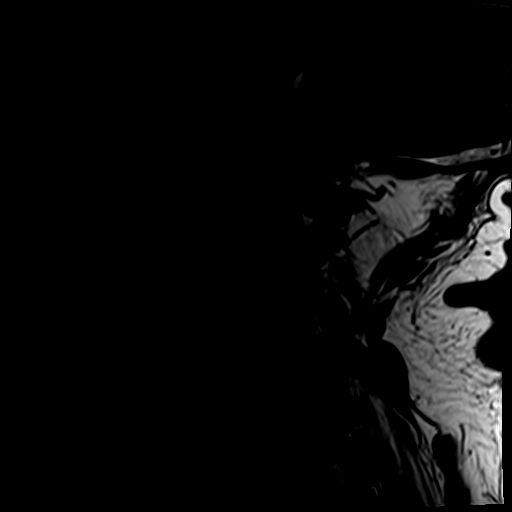
[im 10/13]
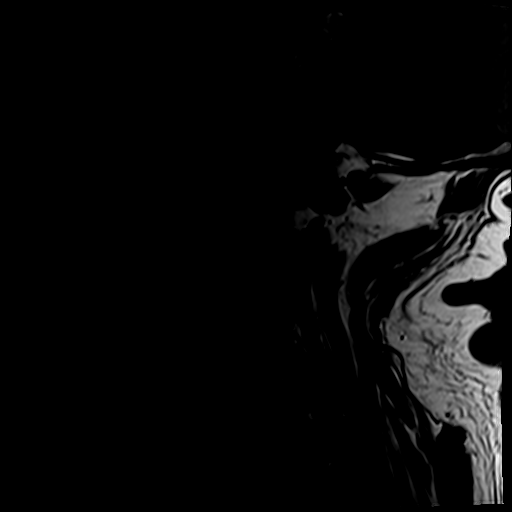
[im 13/13]
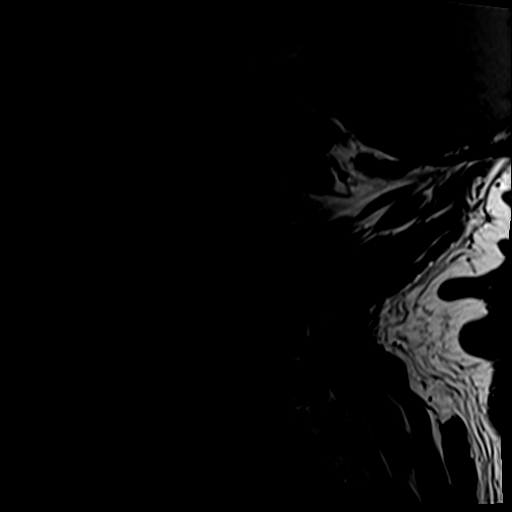

[Series 5: tir sag · sagittal · 3.3mm · 0.37mm/px · 4 of 13 slices shown]
[im 1/13]
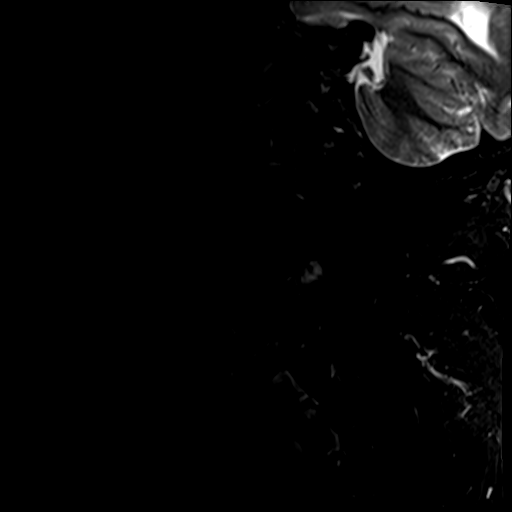
[im 3/13]
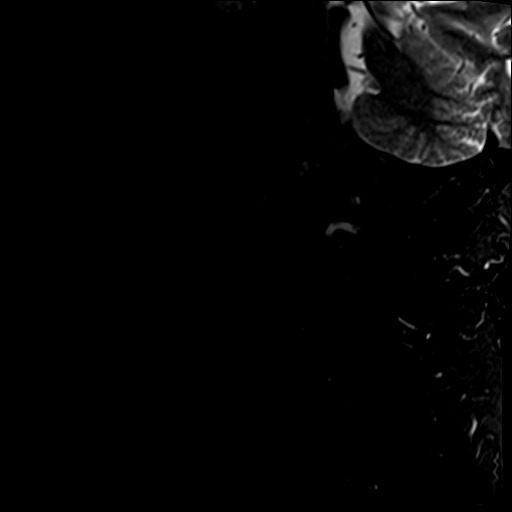
[im 8/13]
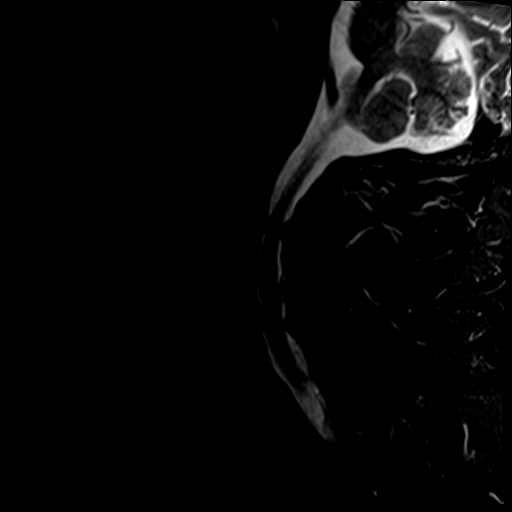
[im 13/13]
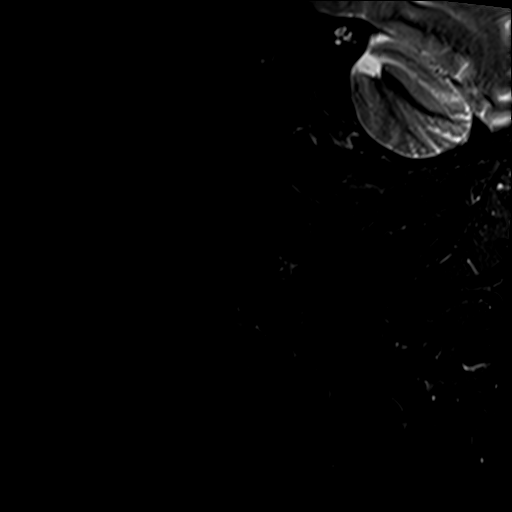

[Series 7: T2 · axial · 3.0mm · 0.70mm/px · z∈[-87,+19]mm · 9 of 30 slices shown]
[im 1/30]
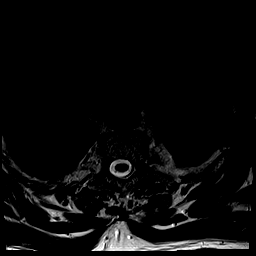
[im 5/30]
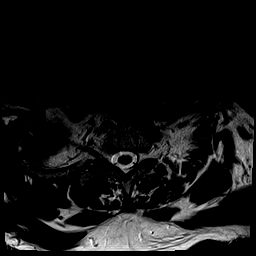
[im 9/30]
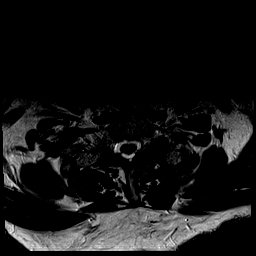
[im 13/30]
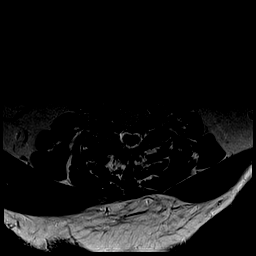
[im 15/30]
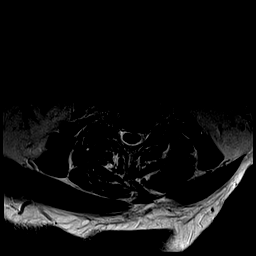
[im 17/30]
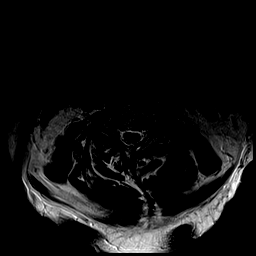
[im 21/30]
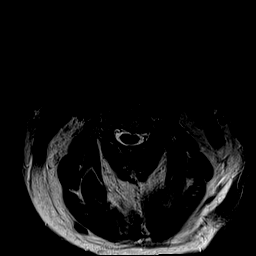
[im 25/30]
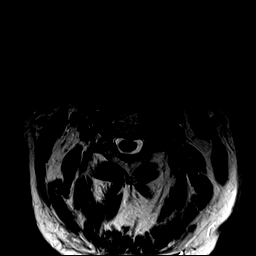
[im 30/30]
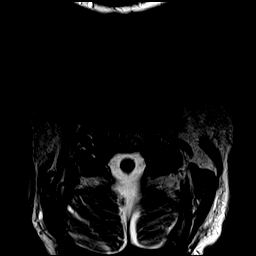

[25 of 48 positions shown; findings below may reference images not displayed]

FINDINGS: Alignment: Trace anterolisthesis at C7-T1, unchanged.

Vertebrae: Mildly heterogeneous marrow signal without focal lesion.
No fracture or evidence of discitis.

Cord: Normal signal.  Cord flattening at C3-C4.

Posterior Fossa, vertebral arteries, paraspinal tissues: Negative.

Disc levels:

C2-C3: Moderate bilateral facet arthropathy and mild bilateral
uncovertebral hypertrophy. Moderate left and mild right
neuroforaminal stenosis. No spinal canal stenosis.

C3-C4: Broad-based posterior disc protrusion. Severe left facet
arthropathy. Mild bilateral uncovertebral hypertrophy. Prominent
ligamentum flavum hypertrophy. Severe central spinal canal stenosis
with cord flattening. Severe bilateral neuroforaminal stenosis.

C4-C5: Minimal disc bulging. Severe right facet arthropathy. Mild
central spinal canal stenosis. Severe right and mild left
neuroforaminal stenosis.

C5-C6: Central and right foraminal disc protrusion. Severe right and
mild left facet arthropathy. Mild central spinal canal stenosis.
Severe left and moderate right neuroforaminal stenosis.

C6-C7: Tiny central disc protrusion. Severe right facet arthropathy.
Severe right neuroforaminal stenosis. No spinal canal or left
neuroforaminal stenosis.

C7-T1:  Moderate bilateral facet arthropathy.  No stenosis.
IMPRESSION: 1. Multilevel cervical spondylosis as described above, worst at
C3-C4 where there is severe central spinal canal and bilateral
neuroforaminal stenosis.
2. Additional severe neuroforaminal stenosis on the right at C4-C5
and C6-C7, and on the left at C5-C6.

## 2018-11-23 SURGERY — LEFT HEART CATH AND CORONARY ANGIOGRAPHY
Anesthesia: LOCAL

## 2018-11-23 MED ORDER — SODIUM CHLORIDE 0.9 % IV SOLN: INTRAVENOUS | Status: AC

## 2018-11-23 MED ORDER — LABETALOL HCL 5 MG/ML IV SOLN: 10.0000 mg | INTRAVENOUS | Status: AC | PRN

## 2018-11-23 MED ORDER — FENTANYL CITRATE (PF) 100 MCG/2ML IJ SOLN
INTRAMUSCULAR | Status: AC
  Filled 2018-11-23: qty 2

## 2018-11-23 MED ORDER — HYDRALAZINE HCL 20 MG/ML IJ SOLN: 10.0000 mg | INTRAMUSCULAR | Status: AC | PRN

## 2018-11-23 MED ORDER — SODIUM CHLORIDE 0.9 % WEIGHT BASED INFUSION
3.0000 mL/kg/h | INTRAVENOUS | Status: DC
  Administered 2018-11-23: 3 mL/kg/h via INTRAVENOUS

## 2018-11-23 MED ORDER — LIDOCAINE HCL (PF) 1 % IJ SOLN
INTRAMUSCULAR | Status: AC
  Filled 2018-11-23: qty 30

## 2018-11-23 MED ORDER — HEPARIN (PORCINE) IN NACL 1000-0.9 UT/500ML-% IV SOLN: INTRAVENOUS | Status: DC | PRN

## 2018-11-23 MED ORDER — SODIUM CHLORIDE 0.9% FLUSH: 3.0000 mL | Freq: Two times a day (BID) | INTRAVENOUS | Status: DC

## 2018-11-23 MED ORDER — SODIUM CHLORIDE 0.9 % IV SOLN: 250.0000 mL | INTRAVENOUS | Status: DC | PRN

## 2018-11-23 MED ORDER — HEPARIN (PORCINE) IN NACL 1000-0.9 UT/500ML-% IV SOLN
INTRAVENOUS | Status: AC
  Filled 2018-11-23: qty 1000

## 2018-11-23 MED ORDER — MIDAZOLAM HCL 2 MG/2ML IJ SOLN
INTRAMUSCULAR | Status: DC | PRN
  Administered 2018-11-23: 1 mg via INTRAVENOUS

## 2018-11-23 MED ORDER — SODIUM CHLORIDE 0.9% FLUSH: 3.0000 mL | INTRAVENOUS | Status: DC | PRN

## 2018-11-23 MED ORDER — HEPARIN (PORCINE) IN NACL 1000-0.9 UT/500ML-% IV SOLN
INTRAVENOUS | Status: DC | PRN
  Administered 2018-11-23: 500 mL

## 2018-11-23 MED ORDER — SODIUM CHLORIDE 0.9 % WEIGHT BASED INFUSION: 1.0000 mL/kg/h | INTRAVENOUS | Status: DC

## 2018-11-23 MED ORDER — ASPIRIN 81 MG PO CHEW
CHEWABLE_TABLET | ORAL | Status: AC
  Filled 2018-11-23: qty 1

## 2018-11-23 MED ORDER — VERAPAMIL HCL 2.5 MG/ML IV SOLN
INTRAVENOUS | Status: AC
  Filled 2018-11-23: qty 2

## 2018-11-23 MED ORDER — FENTANYL CITRATE (PF) 100 MCG/2ML IJ SOLN
INTRAMUSCULAR | Status: DC | PRN
  Administered 2018-11-23: 25 ug via INTRAVENOUS

## 2018-11-23 MED ORDER — HEPARIN SODIUM (PORCINE) 1000 UNIT/ML IJ SOLN
INTRAMUSCULAR | Status: DC | PRN
  Administered 2018-11-23: 4000 [IU] via INTRAVENOUS

## 2018-11-23 MED ORDER — MIDAZOLAM HCL 2 MG/2ML IJ SOLN
INTRAMUSCULAR | Status: AC
  Filled 2018-11-23: qty 2

## 2018-11-23 MED ORDER — VERAPAMIL HCL 2.5 MG/ML IV SOLN
INTRAVENOUS | Status: DC | PRN
  Administered 2018-11-23: 08:00:00 10 mL via INTRA_ARTERIAL

## 2018-11-23 MED ORDER — ASPIRIN 81 MG PO TBEC: 81.0000 mg | DELAYED_RELEASE_TABLET | Freq: Every day | ORAL | Status: DC

## 2018-11-23 MED ORDER — IOHEXOL 350 MG/ML SOLN
INTRAVENOUS | Status: DC | PRN
  Administered 2018-11-23: 08:00:00 80 mL via INTRA_ARTERIAL

## 2018-11-23 MED ORDER — LIDOCAINE HCL (PF) 1 % IJ SOLN
INTRAMUSCULAR | Status: DC | PRN
  Administered 2018-11-23: 3 mL

## 2018-11-23 SURGICAL SUPPLY — 9 items

## 2018-11-23 NOTE — Discharge Summary (Addendum)
Discharge Summary    Patient ID: Gary Diaz,  MRN: [id number], DOB/AGE: 65/03/1955 65 y.o.  Admit date: 11/21/2018 Discharge date: 11/23/2018  Primary Care Provider: Nash ShearerLong, Randy Primary Cardiologist: Norman HerrlichBrian Munley, MD  Discharge Diagnoses    Active Problems:   NSTEMI (non-ST elevated myocardial infarction) [facility](HCC)   Chest pain of uncertain etiology   Elevated troponin   Allergies Allergies  Allergen Reactions  . Latex     REACTION: blisters REACTION: blisters    Diagnostic Studies/Procedures    Cath: 11/23/18   Prox RCA lesion is 20% stenosed.  Dist RCA lesion is 20% stenosed.  Prox Cx to Mid Cx lesion is 40% stenosed.  Previously placed Prox LAD to Mid LAD stent (unknown type) is widely patent.   1. Mild non-obstructive disease in the RCA and circumflex 2. Patent mid LAD stents with no restenosis 3. Normal LV filling pressures  Recommendations: Medical management of CAD.   Diagnostic Dominance: Right   TTE: 11/22/18  IMPRESSIONS    1. The left ventricle has mild-moderately reduced systolic function, with an ejection fraction of 40-45%. The cavity size was normal. There is mildly increased left ventricular wall thickness. Left ventricular diastolic Doppler parameters are consistent  with impaired relaxation. Indeterminate filling pressures The E/e' is 8-15.  2. Severe hypokinesis of the left ventricular, entire inferior wall and lateral wall.  3. The right ventricle has normal systolic function. The cavity was normal. There is no increase in right ventricular wall thickness.  4. The mitral valve is grossly normal.  5. The tricuspid valve is grossly normal.  6. The aortic valve is tricuspid. Mild sclerosis of the aortic valve. Aortic valve regurgitation is trivial by color flow Doppler. No stenosis of the aortic valve.  7. The aorta is normal unless otherwise noted.  SUMMARY   LVEF 40-45%, mild LVH, inferior and lateral hypokinesis, grade 1  DD, indeterminate LV filling pressure, normal RV systolic function, normal biatrial size, aortic valve sclerosis with trivial AI, mild TR, RVSP 26 mmHg, normal IVC _____________   History of Present Illness     Gary Diaz is a 63 yo male with PMH of CAD, CVA, prior GI bleed, HTN and HLD who developed 10/10 chest pressure on the morning of 8/29 soon after getting out of bed.  It was associated with shortness of breath and diaphoresis but no nausea.  He tried taking nitroglycerin without relief.  Symptoms worsened when he tried to walk.  He has a remote history of MI with LAD PCI (patient is unaware of when this occurred).  This discomfort was more severe than his prior MI.  There was no associated lightheadedness, dizziness, or palpitations.  For the last week he also reported lower extremity edema, which was uncommon for him.  His last cath in 2017 showed mild, non-obstructive disease.  He had a stress echo 10/2017 that showed normal systolic function and no ischemia.  He achieved 7 METS.    In the ED Gary Diaz EKG showed sinus rhythm without acute changes.  Initial high-sensitivity troponin was 187 with an increase to 780.  He remained hemodynamically stable.  Therefore cardiology was consulted.  At the time of assessment he reported 5 out of 10 chest pain and a headache.  Hospital Course     He was admitted and placed on IV heparin with plans for cardiac cath. HsT peaked at 780. LDL was noted at 75. Underwent cath noted above with mild non-obstructive disease in the RCA  and circumflex, along with patent mid LAD stent with no restenosis and normal LV filling pressures. No further chest pain during admission. Follow up echo showed mild decline in EF to 40-45% with severe hypokinesis in the inferior/lateral wall. Suspect he could have had a transient plaque or vasospasm. Will continue with home medications with ASA restarted this admission.   General: Well developed, well nourished, male  appearing in no acute distress. Head: Normocephalic, atraumatic.  Neck: Supple without bruits, JVD. Lungs:  Resp regular and unlabored, CTA. Heart: RRR, S1, S2, no murmur; no rub. Abdomen: Soft, non-tender, non-distended with normoactive bowel sounds. No hepatomegaly. No rebound/guarding. No obvious abdominal masses. Extremities: No clubbing, cyanosis, edema. Distal pedal pulses are 2+ bilaterally. Right radial cath site stable without bruising or hematoma Neuro: Alert and oriented X 3. Moves all extremities spontaneously. Psych: Normal affect.  Gary Diaz was seen by Dr. Allyson Sabal and determined stable for discharge home. Follow up in the office has been arranged. Medications are listed below.   _____________  Discharge Vitals Blood pressure (!) 150/80, pulse 68, temperature 98 F (36.7 C), temperature source Oral, resp. rate 17, height 5\' 7"  (1.702 m), weight 79.4 kg, SpO2 92 %.  Filed Weights   11/21/18 1650 11/22/18 0423 11/23/18 0433  Weight: 81.4 kg 81 kg 79.4 kg    Labs & Radiologic Studies    CBC Recent Labs    11/21/18 1300 11/22/18 0347 11/23/18 0509  WBC 5.8 8.2 10.4  NEUTROABS 4.1  --   --   HGB 14.9 15.2 16.0  HCT 44.2 44.9 48.2  MCV 92.9 92.0 93.2  PLT 95* 87* 102*   Basic Metabolic Panel Recent Labs    46/04/79 1300 11/22/18 0347  NA 142 137  K 3.9 3.8  CL 105 101  CO2 29 25  GLUCOSE 106* 103*  BUN 7* 9  CREATININE 0.84 0.81  CALCIUM 8.5* 8.7*   Liver Function Tests No results for input(s): AST, ALT, ALKPHOS, BILITOT, PROT, ALBUMIN in the last 72 hours. No results for input(s): LIPASE, AMYLASE in the last 72 hours. Cardiac Enzymes No results for input(s): CKTOTAL, CKMB, CKMBINDEX, TROPONINI in the last 72 hours. BNP Invalid input(s): POCBNP D-Dimer No results for input(s): DDIMER in the last 72 hours. Hemoglobin A1C No results for input(s): HGBA1C in the last 72 hours. Fasting Lipid Panel Recent Labs    11/22/18 0347  CHOL 141   HDL 38*  LDLCALC 75  TRIG 987  CHOLHDL 3.7   Thyroid Function Tests No results for input(s): TSH, T4TOTAL, T3FREE, THYROIDAB in the last 72 hours.  Invalid input(s): FREET3 _____________  Dg Chest 2 View  Result Date: 11/21/2018 CLINICAL DATA:  Chest pain. EXAM: CHEST - 2 VIEW COMPARISON:  None. FINDINGS: Healed left rib fractures. No pneumothorax. No nodules or masses. No focal infiltrates. The cardiomediastinal silhouette is unremarkable other than borderline size to the heart. No focal infiltrate, nodule, or mass. IMPRESSION: No active cardiopulmonary disease. Electronically Signed   By: Gerome Sam III M.D   On: 11/21/2018 13:21   Disposition   Pt is being discharged home today in good condition.  Follow-up Plans & Appointments    Follow-up Information    Alver Sorrow, NP Follow up on 12/04/2018.   Specialty: Cardiology Why: at 9:45am for your follow up appt.  Contact information: [address] [place] [address] [phone]          Discharge Instructions    Call  MD for:  redness, tenderness, or signs of infection (pain, swelling, redness, odor or green/yellow discharge around incision site)   Complete by: As directed    Diet - low sodium heart healthy   Complete by: As directed    Discharge instructions   Complete by: As directed    Radial Site Care Refer to this sheet in the next few weeks. These instructions provide you with information on caring for yourself after your procedure. Your caregiver may also give you more specific instructions. Your treatment has been planned according to current medical practices, but problems sometimes occur. Call your caregiver if you have any problems or questions after your procedure. HOME CARE INSTRUCTIONS You may shower the day after the procedure.Remove the bandage (dressing) and gently wash the site with plain soap and water.Gently pat the site dry.  Do not apply powder or lotion to the site.   Do not submerge the affected site in water for 3 to 5 days.  Inspect the site at least twice daily.  Do not flex or bend the affected arm for 24 hours.  No lifting over 5 pounds (2.3 kg) for 5 days after your procedure.  Do not drive home if you are discharged the same day of the procedure. Have someone else drive you.  You may drive 24 hours after the procedure unless otherwise instructed by your caregiver.  What to expect: Any bruising will usually fade within 1 to 2 weeks.  Blood that collects in the tissue (hematoma) may be painful to the touch. It should usually decrease in size and tenderness within 1 to 2 weeks.  SEEK IMMEDIATE MEDICAL CARE IF: You have unusual pain at the radial site.  You have redness, warmth, swelling, or pain at the radial site.  You have drainage (other than a small amount of blood on the dressing).  You have chills.  You have a fever or persistent symptoms for more than 72 hours.  You have a fever and your symptoms suddenly get worse.  Your arm becomes pale, cool, tingly, or numb.  You have heavy bleeding from the site. Hold pressure on the site.   Increase activity slowly   Complete by: As directed        Discharge Medications     Medication List    TAKE these medications   aspirin 81 MG EC tablet Take 1 tablet (81 mg total) by mouth daily. Start taking on: November 24, 2018   atorvastatin 40 MG tablet Commonly known as: LIPITOR Take 40 mg by mouth daily.   carvedilol 12.5 MG tablet Commonly known as: COREG Take 12.5 mg by mouth 2 (two) times daily with a meal.   cetirizine 10 MG tablet Commonly known as: ZYRTEC Take 10 mg by mouth daily.   citalopram 40 MG tablet Commonly known as: CELEXA Take 40 mg by mouth daily.   docusate sodium 50 MG capsule Commonly known as: COLACE Take 50 mg by mouth daily as needed for mild constipation.   fluticasone 50 MCG/ACT nasal spray Commonly known as: FLONASE Place 2 sprays into both nostrils as  needed for allergies or rhinitis.   isosorbide mononitrate 60 MG 24 hr tablet Commonly known as: IMDUR Take 60 mg by mouth daily.   lactulose 10 GM/15ML solution Commonly known as: CHRONULAC Take 30 g by mouth daily as needed for mild constipation or moderate constipation.   Linzess 145 MCG Caps capsule Generic drug: linaclotide Take 145 mcg by mouth as needed (constipation).  multivitamin per tablet Take 1 tablet by mouth daily.   nitroGLYCERIN 0.4 MG SL tablet Commonly known as: NITROSTAT Place 0.4 mg under the tongue every 5 (five) minutes as needed.   omeprazole 40 MG capsule Commonly known as: PRILOSEC Take 40 mg by mouth 2 (two) times daily.   Oxycodone HCl 10 MG Tabs Take 10 mg by mouth every 8 (eight) hours as needed (pain). for pain   Pataday 0.2 % Soln Generic drug: Olopatadine HCl Place 1 drop into both eyes as needed (dry eye).   ProAir HFA 108 (90 Base) MCG/ACT inhaler Generic drug: albuterol Inhale 2 puffs into the lungs every 4 (four) hours as needed (shortness of breath).   ranolazine 500 MG 12 hr tablet Commonly known as: RANEXA Take 500 mg by mouth 2 (two) times daily.   tamsulosin 0.4 MG Caps capsule Commonly known as: FLOMAX Take 0.4 mg by mouth at bedtime.   testosterone cypionate 200 MG/ML injection Commonly known as: DEPOTESTOSTERONE CYPIONATE Inject 200 mg into the muscle every 14 (fourteen) days.        Acute coronary syndrome (MI, NSTEMI, STEMI, etc) this admission?:  No.  The elevated Troponin was due to the acute medical illness or demand ischemia.     Outstanding Labs/Studies   N/a   Duration of Discharge Encounter   Greater than 30 minutes including physician time.  Signed, Reino Bellis NP-C 11/23/2018, 2:56 PM  Agree with note by Reino Bellis NP-C  Gary Diaz was admitted with chest pain and non-STEMI.  He has history of prior stenting.  His troponins rose to 700.  Catheterization performed via the right  radial approach this morning by Dr. Angelena Form revealed a patent LAD stent with otherwise noncritical CAD.  He did have mild to moderate LV dysfunction.  It is possible that his elevated troponin was secondary to demand ischemia.  He stable for discharge home this afternoon, TOC 7, and then return office visit with Dr. Bettina Gavia.   Lorretta Harp, M.D., [place], [facility], [place] [address] [place], [place]  [address]  [phone] 11/23/2018 3:03 PM

## 2018-11-23 NOTE — Interval H&P Note (Signed)
History and Physical Interval Note:  11/23/2018 7:19 AM  Gary Diaz  has presented today for surgery, with the diagnosis of NSTEMI.  The various methods of treatment have been discussed with the patient and family. After consideration of risks, benefits and other options for treatment, the patient has consented to  Procedure(s): LEFT HEART CATH AND CORONARY ANGIOGRAPHY (N/A) as a surgical intervention.  The patient's history has been reviewed, patient examined, no change in status, stable for surgery.  I have reviewed the patient's chart and labs.  Questions were answered to the patient's satisfaction.    Cath Lab Visit (complete for each Cath Lab visit)  Clinical Evaluation Leading to the Procedure:   ACS: Yes.    Non-ACS:    Anginal Classification: CCS IV  Anti-ischemic medical therapy: Maximal Therapy (2 or more classes of medications)  Non-Invasive Test Results: No non-invasive testing performed  Prior CABG: No previous CABG        Lauree Chandler

## 2018-11-23 NOTE — Progress Notes (Signed)
Cardiac cath this am. Patent LAD stent.  No obstructive disease noted in the RCA and Circumflex.  Normal LV filling pressure.   Unable to reach his sister or significant other by phone today to review his cath results.   Lauree Chandler 11/23/2018 8:16 AM

## 2018-12-04 ENCOUNTER — Ambulatory Visit: Payer: Medicare Other | Admitting: Family

## 2018-12-07 ENCOUNTER — Ambulatory Visit: Payer: Medicare Other | Admitting: Family

## 2018-12-07 NOTE — Progress Notes (Deleted)
Office Visit    Patient Name: Gary Diaz Date of Encounter: 12/07/2018  Primary Care Provider:  Rogene Houston, MD Primary Cardiologist:  Shirlee More, MD Electrophysiologist:  None   Chief Complaint    Gary Diaz is a 63 y.o. male with a hx of *** presents today for ***   Past Medical History    Past Medical History:  Diagnosis Date  . Acute blood loss anemia 06/12/2015  . Acute lower GI bleeding 06/12/2015  . Bronchiectasis with acute exacerbation ([facility]) 11/17/2012  . Bronchiectasis without acute exacerbation ([facility]) 06/21/2010   Ct chest 2011:  bilat lower lobe bronchiectasis with tiny nodular infiltrates scattered in the area (?MAC colonization) Spirometry 2012:  No airflow obstruction Spirometry 2014:  Normal.  [facility] 2016:  Normal flows.    . Cervicalgia 10/21/2017  . Cholelithiasis 05/29/2015  . Chronic midline low back pain without sciatica 10/21/2017  . Coronary artery disease involving native coronary artery with angina pectoris ([place]) 11/14/2014   Overview:  1.Taxus Stent to the LAD 06/30/07 with patent previous LAD stents  2. Cath 05/21/2011 without restenosis or obstructive stenosis 3. Cath 03/04/13 with patent stent, EF 55-60%.  . CVA (cerebral vascular accident) ([employer])   . Diverticulitis of rectosigmoid 05/29/2015  . Essential hypertension 05/14/2010   Qualifier: Diagnosis of  By: Gary Diaz    . GERD (gastroesophageal reflux disease)    s/p esophageal dilation for stricture  . 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 Hyperlipidemia   . Hypertension   . Hypokalemia 06/12/2015  . Hypophosphatemia 06/12/2015  . Impingement syndrome of right shoulder 05/09/2016  . Leukocytosis 06/12/2015  . Mixed hyperlipidemia 02/29/2016   Overview:  Added automatically from request for surgery [id number]  . MYOCARDIAL INFARCTION 05/14/2010   Qualifier: History of  By: Gary Diaz    . Myocardial infarction ([place])    +Sees [facility]. +CAD -recent nuclear scan with EF  40%, ?ischemia  . Postoperative ileus ([place]) 06/05/2015  . TIA (transient ischemic attack) 05/14/2010   Qualifier: Diagnosis of  By: Gary Diaz     Past Surgical History:  Procedure Laterality Date  . CORONARY STENT PLACEMENT     x 3  . LEFT HEART CATH AND CORONARY ANGIOGRAPHY N/A 11/23/2018   Procedure: LEFT HEART CATH AND CORONARY ANGIOGRAPHY;  Surgeon: Burnell Blanks, MD;  Location: [place] [facility];  Service: Cardiovascular;  Laterality: N/A;  . ROTATOR CUFF REPAIR     right  . TOTAL HIP ARTHROPLASTY     x 2   Allergies  Allergies  Allergen Reactions  . Latex     REACTION: blisters REACTION: blisters    History of Present Illness    Gary Diaz is a 63 y.o. male with a hx of CAD (s/p MI with LAD PCI unknown date, cardiac cath 11/2018 with patent stent LAD and nonobstructive CAD), CVA, prior GI bleed, HTn, HLD last seen while hospitalized. He was last seen by Dr. Bettina Diaz on 01/30/2018.   He was admitted to [facility]21[facility]18 for chest pain associated with SOB and diaphoresis unrelieved by nitroglycerin. Also reported LE edema x1 week. High sensitivity troponin 187 ? 780. Admitted with non-STEMI. Cardiac catheterization on 11/23/18 with patent LAD stent and nonobstructive CAD (Prox RCA 20%, Dist RCA 20%, Prox Cx to mid Cx 40%). Elevated troponin demand ischemia vs transient plaque vs vasospasm. Echo 11/22/18 EF 40-45%, mild LVH, gr1DD, severe hypokinesis LV inferior and lateral wall, RV normal size  function, trivial AI, mild TR.   EKGs/Labs/Other Studies Reviewed:   The following studies were reviewed today:  Cardiac cath 11/23/18   Prox RCA lesion is 20% stenosed.  Dist RCA lesion is 20% stenosed.  Prox Cx to Mid Cx lesion is 40% stenosed.  Previously placed Prox LAD to Mid LAD stent (unknown type) is widely patent.  1. Mild non-obstructive disease in the RCA and circumflex 2. Patent mid LAD stents with no restenosis 3. Normal LV filling pressures   Recommendations: Medical management of CAD.      Echo 11/22/18  1. The left ventricle has mild-moderately reduced systolic function, with an ejection fraction of 40-45%. The cavity size was normal. There is mildly increased left ventricular wall thickness. Left ventricular diastolic Doppler parameters are consistent  with impaired relaxation. Indeterminate filling pressures The E/e' is 8-15.  2. Severe hypokinesis of the left ventricular, entire inferior wall and lateral wall.  3. The right ventricle has normal systolic function. The cavity was normal. There is no increase in right ventricular wall thickness.  4. The mitral valve is grossly normal.  5. The tricuspid valve is grossly normal.  6. The aortic valve is tricuspid. Mild sclerosis of the aortic valve. Aortic valve regurgitation is trivial by color flow Doppler. No stenosis of the aortic valve.  7. The aorta is normal unless otherwise noted.  EKG:  EKG is *** ordered today.  The ekg ordered today demonstrates ***  Recent Labs: 11/21/2018: B Natriuretic Peptide 35.2 11/22/2018: BUN 9; Creatinine, Ser 0.81; Potassium 3.8; Sodium 137 11/23/2018: Hemoglobin 16.0; Platelets 102  Recent Lipid Panel    Component Value Date/Time   CHOL 141 11/22/2018 0347   TRIG 138 11/22/2018 0347   HDL 38 (L) 11/22/2018 0347   CHOLHDL 3.7 11/22/2018 0347   VLDL 28 11/22/2018 0347   LDLCALC 75 11/22/2018 0347    Home Medications   No outpatient medications have been marked as taking for the 12/07/18 encounter (Appointment) with Gary Dubonnet, NP.      Review of Systems    ***   ROS All other systems reviewed and are otherwise negative except as noted above.  Physical Exam    VS:  There were no vitals taken for this visit. , BMI There is no height or weight on file to calculate BMI. GEN: Well nourished, well developed, in no acute distress. HEENT: normal. Neck: Supple, no JVD, carotid bruits, or masses. Cardiac: ***RRR, no murmurs,  rubs, or gallops. No clubbing, cyanosis, edema.  ***Radials/DP/PT 2+ and equal bilaterally.  Respiratory:  ***Respirations regular and unlabored, clear to auscultation bilaterally. GI: Soft, nontender, nondistended, BS + x 4. MS: No deformity or atrophy. Skin: Warm and dry, no rash. Neuro:  Strength and sensation are intact. Psych: Normal affect.  Accessory Clinical Findings    ECG personally reviewed by me today - *** - no acute changes.  Assessment & Plan    1. CAD - Admitted to [facility] for NSTEMI 11/21/18. Cath 11/23/18 with patent stent to LAD, nonobstructive CAD (prox RCA 20%, dist RCA 20%, prox Cx to mid Cx 40%). R radial cath site is ***. Presently on GDMT aspirin, beta blocker, statin Anti-anginal therapies include Imdur, Ranexa, PRN nitroglycerin.  2. HTN -  3. HLD. LDL goal <70 - Lipid profile 11/22/18 with LDL 75.   Disposition: Follow up {follow up:15908} with Dr. Bettina Diaz.   Gary Dubonnet, NP 12/07/2018, 11:41 AM

## 2019-01-17 NOTE — Progress Notes (Signed)
Cardiology Office Note:    Date:  01/19/2019   ID:  Gary Diaz, DOB 1956/01/11, MRN [id number]  PCP:  Rogene Houston, MD  Cardiologist:  Shirlee More, MD    Referring MD: Rogene Houston, MD    ASSESSMENT:    1. Coronary artery disease involving native coronary artery of native heart with angina pectoris ([place])   2. Essential hypertension   3. Hyperlipidemia, unspecified hyperlipidemia type   4. LV dysfunction    PLAN:    In order of problems listed above:  1. Stable after recent ACS without PCI treated medically, it would be appropriate he agrees and add clopidogrel to aspirin and continue combined therapy beta-blocker or nitrates ranolazine and high intensity statin. 2. Stable at target continue current current guideline directed therapy 3. Stable continue high intensity statin check liver function safety lipid profile and LP(a) level I had like to see his LDL less than 70 ideally less than 55 and may require combined therapy with Zetia 4. Check proBNP level if severely elevated transition to beginning Entresto otherwise consider repeat echocardiogram around the time of his next visit   Next appointment: 6 months   Medication Adjustments/Labs and Tests Ordered: Current medicines are reviewed at length with the patient today.  Concerns regarding medicines are outlined above.  No orders of the defined types were placed in this encounter.  No orders of the defined types were placed in this encounter.   Chief Complaint  Patient presents with  . Follow-up  . Coronary Artery Disease  . Hyperlipidemia  . Hypertension    History of Present Illness:    Gary Diaz is a 63 y.o. male with a hx of CAD previously seen by Dr. Geraldo Pitter at [facility] cardiology.  He underwent stress echocardiogram [facility] in [place] office 10/23/2017 his resting ejection fraction was normal and  was interpreted as normal no evidence of ischemia his exercise tolerance was 7  METS and his heart rate and blood pressure response was normal.  Is a history of CAD with remote PCI and stent to left anterior descending coronary artery and last coronary angiogram in 2017 showed mild nonobstructive CAD is other problems include hypertension hyperlipidemia and previous TIA  He was last seen by me 01/28/2018 with stable CAD. He presented to [facility] 11/21/2018 with unstable angina and developed acute coronary syndrome with elevated high-sensitivity troponin.  Echocardiogram showed ejection fraction 40 to 45% with inferior and lateral hypokinesia.  He was referred to and underwent left heart catheterization 11/23/2018 showing patent stent LAD and showing mild nonobstructive CAD in the right coronary artery and the left circumflex coronary vessel.  He is doing well and has rare angina less than 1 time a month usually when he is doing heavy outdoor work and lifting and relieved with nitroglycerin.  I have asked him to wait at least 90 minutes after his morning meds to start doing his activities and encouraged him to begin using prophylactic nitroglycerin.  I think he should be on dual antiplatelet therapy he agrees we will initiate clopidogrel continue his intense medical therapy for CAD involving beta-blocker oral nitrates ranolazine as high intensity statin reassess in 6 months and check labs today including lipid profile LP(a) and CMP and proBNP level.  And next visit we will need to consider repeat echocardiogram if EF is worse in less than 40 transition to Entresto would be appropriate  Date Found: 11/23/2018      Coronary Findings  Diagnostic  Dominance: Right Left Anterior Descending  Vessel is large.  Prox LAD to Mid LAD lesion 0% stenosed  Previously placed Prox LAD to Mid LAD stent (unknown type) is widely patent.  Left Circumflex  Vessel is large.  Prox Cx to Mid Cx lesion 40% stenosed  Prox Cx to Mid Cx lesion is 40% stenosed.  Second Obtuse Marginal Branch   Vessel is large in size.  Right Coronary Artery  Vessel is large.  Prox RCA lesion 20% stenosed  Prox RCA lesion is 20% stenosed.  Dist RCA lesion 20% stenosed  Dist RCA lesion is 20% stenosed.  Intervention  Diagnostic Dominance: Right    Compliance with diet, lifestyle and medications: Yes Past Medical History:  Diagnosis Date  . Acute blood loss anemia 06/12/2015  . Acute lower GI bleeding 06/12/2015  . Bronchiectasis with acute exacerbation ([place]) 11/17/2012  . Bronchiectasis without acute exacerbation ([place]) 06/21/2010   Ct chest 2011:  bilat lower lobe bronchiectasis with tiny nodular infiltrates scattered in the area (?MAC colonization) Spirometry 2012:  No airflow obstruction Spirometry 2014:  Normal.  [name] 2016:  Normal flows.    . Cervicalgia 10/21/2017  . Cholelithiasis 05/29/2015  . Chronic midline low back pain without sciatica 10/21/2017  . Coronary artery disease involving native coronary artery with angina pectoris ([place]) 11/14/2014   Overview:  1.Taxus Stent to the LAD 06/30/07 with patent previous LAD stents  2. Cath 05/21/2011 without restenosis or obstructive stenosis 3. Cath 03/04/13 with patent stent, EF 55-60%.  . CVA (cerebral vascular accident) ([place])   . Diverticulitis of rectosigmoid 05/29/2015  . Essential hypertension 05/14/2010   Qualifier: Diagnosis of  By: Charma Igo    . GERD (gastroesophageal reflux disease)    s/p esophageal dilation for stricture  . 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 Hyperlipidemia   . Hypertension   . Hypokalemia 06/12/2015  . Hypophosphatemia 06/12/2015  . Impingement syndrome of right shoulder 05/09/2016  . Leukocytosis 06/12/2015  . Mixed hyperlipidemia 02/29/2016   Overview:  Added automatically from request for surgery [id number]  . MYOCARDIAL INFARCTION 05/14/2010   Qualifier: History of  By: Charma Igo    . Myocardial infarction ([place])    +Sees [facility]. +CAD -recent nuclear scan with EF 40%, ?ischemia  .  Postoperative ileus ([place]) 06/05/2015  . TIA (transient ischemic attack) 05/14/2010   Qualifier: Diagnosis of  By: Charma Igo      Past Surgical History:  Procedure Laterality Date  . CORONARY STENT PLACEMENT     x 3  . LEFT HEART CATH AND CORONARY ANGIOGRAPHY N/A 11/23/2018   Procedure: LEFT HEART CATH AND CORONARY ANGIOGRAPHY;  Surgeon: Burnell Blanks, MD;  Location: [place] [facility];  Service: Cardiovascular;  Laterality: N/A;  . ROTATOR CUFF REPAIR     right  . TOTAL HIP ARTHROPLASTY     x 2    Current Medications: Current Meds  Medication Sig  . albuterol (PROAIR HFA) 108 (90 Base) MCG/ACT inhaler Inhale 2 puffs into the lungs every 4 (four) hours as needed (shortness of breath).   Marland Kitchen aspirin EC 81 MG EC tablet Take 1 tablet (81 mg total) by mouth daily.  Marland Kitchen atorvastatin (LIPITOR) 40 MG tablet Take 40 mg by mouth daily.   . carvedilol (COREG) 12.5 MG tablet Take 12.5 mg by mouth 2 (two) times daily with a meal.  . cetirizine (ZYRTEC) 10 MG tablet Take 10 mg by mouth daily.   . citalopram (CELEXA) 40 MG  tablet Take 40 mg by mouth daily.  Marland Kitchen docusate sodium (COLACE) 50 MG capsule Take 50 mg by mouth daily as needed for mild constipation.  . fluticasone (FLONASE) 50 MCG/ACT nasal spray Place 2 sprays into both nostrils as needed for allergies or rhinitis.  Marland Kitchen isosorbide mononitrate (IMDUR) 60 MG 24 hr tablet Take 60 mg by mouth daily.  Marland Kitchen lactulose (CHRONULAC) 10 GM/15ML solution Take 30 g by mouth daily as needed for mild constipation or moderate constipation.   Marland Kitchen linaclotide (LINZESS) 145 MCG CAPS capsule Take 145 mcg by mouth as needed (constipation).  . multivitamin (THERAGRAN) per tablet Take 1 tablet by mouth daily.    . nitroGLYCERIN (NITROSTAT) 0.4 MG SL tablet Place 0.4 mg under the tongue every 5 (five) minutes as needed.    . Olopatadine HCl (PATADAY) 0.2 % SOLN Place 1 drop into both eyes as needed (dry eye).   Marland Kitchen omeprazole (PRILOSEC) 40 MG capsule Take 40 mg by  mouth 2 (two) times daily.   . Oxycodone HCl 10 MG TABS Take 10 mg by mouth every 8 (eight) hours as needed (pain). for pain  . ranolazine (RANEXA) 500 MG 12 hr tablet Take 500 mg by mouth 2 (two) times daily.  . tamsulosin (FLOMAX) 0.4 MG CAPS capsule Take 0.4 mg by mouth at bedtime.  Marland Kitchen testosterone cypionate (DEPOTESTOSTERONE CYPIONATE) 200 MG/ML injection Inject 200 mg into the muscle every 14 (fourteen) days.      Allergies:   Latex   Social History   Socioeconomic History  . Marital status: Divorced    Spouse name: Not on file  . Number of children: Not on file  . Years of education: Not on file  . Highest education level: Not on file  Occupational History  . Occupation: disabled    Comment: Dealer    Comment: former Management consultant"  Social Needs  . Financial resource strain: Not on file  . Food insecurity    Worry: Not on file    Inability: Not on file  . Transportation needs    Medical: Not on file    Non-medical: Not on file  Tobacco Use  . Smoking status: Former Smoker    Years: 10.00    Types: Cigars    Quit date: 03/25/1988    Years since quitting: 30.8  . Smokeless tobacco: Former Systems developer    Types: Chew  . Tobacco comment: Smoked 6 cigars a day  Substance and Sexual Activity  . Alcohol use: Yes    Comment: occ  . Drug use: Not Currently  . Sexual activity: Not on file  Lifestyle  . Physical activity    Days per week: Not on file    Minutes per session: Not on file  . Stress: Not on file  Relationships  . Social Herbalist on phone: Not on file    Gets together: Not on file    Attends religious service: Not on file    Active member of club or organization: Not on file    Attends meetings of clubs or organizations: Not on file    Relationship status: Not on file  Other Topics Concern  . Not on file  Social History Narrative  . Not on file     Family History: The patient's family history includes Allergies in his mother;  Hypertension in his mother; Rheum arthritis in his mother. ROS:   Please see the history of present illness.    All other systems  reviewed and are negative.  EKGs/Labs/Other Studies Reviewed:    The following studies were reviewed today:  Recent Labs: 11/21/2018: B Natriuretic Peptide 35.2 11/22/2018: BUN 9; Creatinine, Ser 0.81; Potassium 3.8; Sodium 137 11/23/2018: Hemoglobin 16.0; Platelets 102  Recent Lipid Panel    Component Value Date/Time   CHOL 141 11/22/2018 0347   TRIG 138 11/22/2018 0347   HDL 38 (L) 11/22/2018 0347   CHOLHDL 3.7 11/22/2018 0347   VLDL 28 11/22/2018 0347   LDLCALC 75 11/22/2018 0347    Physical Exam:    VS:  BP 130/60   Pulse 66   Ht _0  (1.702 m)   Wt 187 lb 3.2 oz (84.9 kg)   SpO2 96%   BMI 29.32 kg/m     Wt Readings from Last 3 Encounters:  01/19/19 187 lb 3.2 oz (84.9 kg)  11/23/18 175 lb 0.7 oz (79.4 kg)  01/30/18 182 lb (82.6 kg)     GEN:  Well nourished, well developed in no acute distress HEENT: Normal NECK: No JVD; No carotid bruits LYMPHATICS: No lymphadenopathy CARDIAC: RRR, no murmurs, rubs, gallops RESPIRATORY:  Clear to auscultation without rales, wheezing or rhonchi  ABDOMEN: Soft, non-tender, non-distended MUSCULOSKELETAL:  No edema; No deformity  SKIN: Warm and dry NEUROLOGIC:  Alert and oriented x 3 PSYCHIATRIC:  Normal affect    Signed, Shirlee More, MD  01/19/2019 11:06 AM    [place]

## 2019-01-19 ENCOUNTER — Other Ambulatory Visit: Payer: Self-pay

## 2019-01-19 ENCOUNTER — Encounter: Payer: Self-pay | Admitting: Cardiology

## 2019-01-19 ENCOUNTER — Ambulatory Visit (INDEPENDENT_AMBULATORY_CARE_PROVIDER_SITE_OTHER): Payer: Medicare Other | Admitting: Cardiology

## 2019-01-19 VITALS — BP 130/60 | HR 66 | Ht 67.0 in | Wt 187.2 lb

## 2019-01-19 DIAGNOSIS — E785 Hyperlipidemia, unspecified: Secondary | ICD-10-CM

## 2019-01-19 DIAGNOSIS — I25119 Atherosclerotic heart disease of native coronary artery with unspecified angina pectoris: Secondary | ICD-10-CM

## 2019-01-19 DIAGNOSIS — I10 Essential (primary) hypertension: Secondary | ICD-10-CM | POA: Diagnosis not present

## 2019-01-19 DIAGNOSIS — I519 Heart disease, unspecified: Secondary | ICD-10-CM | POA: Diagnosis not present

## 2019-01-19 MED ORDER — CLOPIDOGREL BISULFATE 75 MG PO TABS: 75.0000 mg | ORAL_TABLET | Freq: Every day | ORAL | 1 refills | Status: DC

## 2019-01-19 NOTE — Addendum Note (Signed)
Addended by: Austin Miles on: 01/19/2019 11:13 AM   Modules accepted: Orders

## 2019-01-19 NOTE — Patient Instructions (Signed)
Medication Instructions:  Your physician has recommended you make the following change in your medication:   START clopidogrel (plavix) 75 mg: Take 1 tablet daily   *If you need a refill on your cardiac medications before your next appointment, please call your pharmacy*  Lab Work: Your physician recommends that you return for lab work today: CMP, ProBNP, lipid panel.   If you have labs (blood work) drawn today and your tests are completely normal, you will receive your results only by: Marland Kitchen MyChart Message (if you have MyChart) OR . A paper copy in the mail If you have any lab test that is abnormal or we need to change your treatment, we will call you to review the results.  Testing/Procedures: None  Follow-Up: At [facility], you and your health needs are our priority.  As part of our continuing mission to provide you with exceptional heart care, we have created designated Provider Care Teams.  These Care Teams include your primary Cardiologist (physician) and Advanced Practice Providers (APPs -  Physician Assistants and Nurse Practitioners) who all work together to provide you with the care you need, when you need it.  Your next appointment:   6 months  The format for your next appointment:   In Person  Provider:   Norman Herrlich, MD   Clopidogrel tablets What is this medicine? CLOPIDOGREL (kloh PID oh grel) helps to prevent blood clots. This medicine is used to prevent heart attack, stroke, or other vascular events in people who are at high risk. This medicine may be used for other purposes; ask your health care provider or pharmacist if you have questions. COMMON BRAND NAME(S): Plavix What should I tell my health care provider before I take this medicine? They need to know if you have any of the following conditions:  bleeding disorders  bleeding in the brain  having surgery  history of stomach bleeding  an unusual or allergic reaction to clopidogrel, other  medicines, foods, dyes, or preservatives  pregnant or trying to get pregnant  breast-feeding How should I use this medicine? Take this medicine by mouth with a glass of water. Follow the directions on the prescription label. You may take this medicine with or without food. If it upsets your stomach, take it with food. Take your medicine at regular intervals. Do not take it more often than directed. Do not stop taking except on your doctor's advice. A special MedGuide will be given to you by the pharmacist with each prescription and refill. Be sure to read this information carefully each time. Talk to your pediatrician regarding the use of this medicine in children. Special care may be needed. Overdosage: If you think you have taken too much of this medicine contact a poison control center or emergency room at once. NOTE: This medicine is only for you. Do not share this medicine with others. What if I miss a dose? If you miss a dose, take it as soon as you can. If it is almost time for your next dose, take only that dose. Do not take double or extra doses. What may interact with this medicine? Do not take this medicine with the following medications:  dasabuvir; ombitasvir; paritaprevir; ritonavir  defibrotide  selexipag This medicine may also interact with the following medications:  certain medicines that treat or prevent blood clots like warfarin  narcotic medicines for pain  NSAIDs, medicines for pain and inflammation, like ibuprofen or naproxen  repaglinide  SNRIs, medicines for depression, like desvenlafaxine, duloxetine,  levomilnacipran, venlafaxine  SSRIs, medicines for depression, like citalopram, escitalopram, fluoxetine, fluvoxamine, paroxetine, sertraline  stomach acid blockers like cimetidine, esomeprazole, omeprazole This list may not describe all possible interactions. Give your health care provider a list of all the medicines, herbs, non-prescription drugs, or  dietary supplements you use. Also tell them if you smoke, drink alcohol, or use illegal drugs. Some items may interact with your medicine. What should I watch for while using this medicine? Visit your doctor or health care professional for regular check-ups. Do not stop taking your medicine unless your doctor tells you to. Notify your doctor or health care professional and seek emergency treatment if you develop breathing problems; changes in vision; chest pain; severe, sudden headache; pain, swelling, warmth in the leg; trouble speaking; sudden numbness or weakness of the face, arm or leg. These can be signs that your condition has gotten worse. If you are going to have surgery or dental work, tell your doctor or health care professional that you are taking this medicine. Certain genetic factors may reduce the effect of this medicine. Your doctor may use genetic tests to determine treatment. Only take aspirin if you are instructed to. Low doses of aspirin are used with this medicine to treat some conditions. Taking aspirin with this medicine can increase your risk of bleeding so you must be careful. Talk to your doctor or pharmacist if you have questions. What side effects may I notice from receiving this medicine? Side effects that you should report to your doctor or health care professional as soon as possible:  allergic reactions like skin rash, itching or hives, swelling of the face, lips, or tongue  signs and symptoms of bleeding such as bloody or black, tarry stools; red or dark-brown urine; spitting up blood or brown material that looks like coffee grounds; red spots on the skin; unusual bruising or bleeding from the eye, gums, or nose  signs and symptoms of a blood clot such as breathing problems; changes in vision; chest pain; severe, sudden headache; pain, swelling, warmth in the leg; trouble speaking; sudden numbness or weakness of the face, arm or leg  signs and symptoms of low blood  sugar such as feeling anxious; confusion; dizziness; increased hunger; unusually weak or tired; increased sweating; shakiness; cold, clammy skin; irritable; headache; blurred vision; fast heartbeat; loss of consciousness Side effects that usually do not require medical attention (report to your doctor or health care professional if they continue or are bothersome):  constipation  diarrhea  headache  upset stomach This list may not describe all possible side effects. Call your doctor for medical advice about side effects. You may report side effects to FDA at 1-800-FDA-1088. Where should I keep my medicine? Keep out of the reach of children. Store at room temperature of 59 to 86 degrees F (15 to 30 degrees C). Throw away any unused medicine after the expiration date. NOTE: This sheet is a summary. It may not cover all possible information. If you have questions about this medicine, talk to your doctor, pharmacist, or health care provider.  2020 Elsevier/Gold Standard (2017-08-11 15:03:38)

## 2019-01-20 LAB — COMPREHENSIVE METABOLIC PANEL
ALT: 19 IU/L (ref 0–44)
AST: 19 IU/L (ref 0–40)
Albumin/Globulin Ratio: 2 (ref 1.2–2.2)
Albumin: 4.3 g/dL (ref 3.8–4.8)
Alkaline Phosphatase: 66 IU/L (ref 39–117)
BUN/Creatinine Ratio: 18 (ref 10–24)
BUN: 17 mg/dL (ref 8–27)
Bilirubin Total: 0.9 mg/dL (ref 0.0–1.2)
CO2: 27 mmol/L (ref 20–29)
Calcium: 9.3 mg/dL (ref 8.6–10.2)
Chloride: 101 mmol/L (ref 96–106)
Creatinine, Ser: 0.96 mg/dL (ref 0.76–1.27)
GFR calc Af Amer: 95 mL/min/{1.73_m2} (ref >59)
GFR calc non Af Amer: 83 mL/min/{1.73_m2} (ref >59)
Globulin, Total: 2.2 g/dL (ref 1.5–4.5)
Glucose: 90 mg/dL (ref 65–99)
Potassium: 4.7 mmol/L (ref 3.5–5.2)
Sodium: 140 mmol/L (ref 134–144)
Total Protein: 6.5 g/dL (ref 6.0–8.5)

## 2019-01-20 LAB — LIPID PANEL
Chol/HDL Ratio: 3.6 ratio (ref 0.0–5.0)
Cholesterol, Total: 182 mg/dL (ref 100–199)
HDL: 50 mg/dL (ref >39)
LDL Chol Calc (NIH): 93 mg/dL (ref 0–99)
Triglycerides: 234 mg/dL — ABNORMAL HIGH (ref 0–149)
VLDL Cholesterol Cal: 39 mg/dL (ref 5–40)

## 2019-01-20 LAB — PRO B NATRIURETIC PEPTIDE: NT-Pro BNP: 94 pg/mL (ref 0–376)

## 2019-05-12 ENCOUNTER — Ambulatory Visit: Payer: Self-pay

## 2019-05-12 ENCOUNTER — Ambulatory Visit (INDEPENDENT_AMBULATORY_CARE_PROVIDER_SITE_OTHER): Payer: Medicare Other | Admitting: Family

## 2019-05-12 ENCOUNTER — Encounter: Payer: Self-pay | Admitting: Family

## 2019-05-12 ENCOUNTER — Other Ambulatory Visit: Payer: Self-pay

## 2019-05-12 VITALS — Ht 67.0 in | Wt 187.0 lb

## 2019-05-12 DIAGNOSIS — M5442 Lumbago with sciatica, left side: Secondary | ICD-10-CM | POA: Diagnosis not present

## 2019-05-12 DIAGNOSIS — G8929 Other chronic pain: Secondary | ICD-10-CM

## 2019-05-12 DIAGNOSIS — M25511 Pain in right shoulder: Secondary | ICD-10-CM

## 2019-05-12 MED ORDER — PREDNISONE 50 MG PO TABS: ORAL_TABLET | ORAL | 0 refills | Status: DC

## 2019-05-12 NOTE — Progress Notes (Signed)
Office Visit Note   Patient: Gary Diaz           Date of Birth: [birth year]           MRN: [id number] Visit Date: 05/12/2019              Requested by: Rogene Houston, MD [address],  [place] [address] PCP: Rogene Houston, MD  Chief Complaint  Patient presents with  . Right Shoulder - Pain  . Lower Back - Pain      HPI: The patient is a 64 year old gentleman seen today for 2 separate issues.  #1 he is complaining of some right shoulder pain this is been ongoing for many weeks.  He has had similar problems in the past is status post a shoulder scope many years ago.  He is complaining of some loss of range of motion unable to reach above his head or behind his back states he must use his left arm to assist him in getting his arm raised.  Due to pain.  Difficulty reaching out in front of the body especially when trying to put ice into the freezer.  Complaining of some numbness and tingling at night difficulty sleeping due to the right shoulder pain the numbness and tingling will radiate down the right upper extremity.  No new injuries to his shoulder or neck.  #2 he has been having some left-sided low back and buttock pain this is sharp stabbing and shooting he has been losing sleep secondary to this pain.  He reports a fall about a month ago onto his left flank.  Denies any weakness no numbness and tingling in his foot the pain radiates through his buttocks.  No red flag symptoms.  Assessment & Plan: Visit Diagnoses:  1. Chronic right shoulder pain   2. Chronic left-sided low back pain with left-sided sciatica     Plan: We will trial a prednisone burst for his low back pain.  Depo-Medrol injection of the right shoulder today without incident.  Patient voiced relief he will follow-up in the office as needed.  Follow-Up Instructions: Return in about 2 weeks (around 05/26/2019), or if symptoms worsen or fail to improve.   Ortho Exam  Patient is alert, oriented, no  adenopathy, well-dressed, normal affect, normal respiratory effort.   Imaging: No results found. No images are attached to the encounter.  Labs: No results found for: HGBA1C, ESRSEDRATE, CRP, LABURIC, REPTSTATUS, GRAMSTAIN, CULT, LABORGA   Lab Results  Component Value Date   ALBUMIN 4.3 01/19/2019   ALBUMIN 3.7 11/27/2006    No results found for: MG No results found for: VD25OH  No results found for: PREALBUMIN CBC EXTENDED Latest Ref Rng & Units 11/23/2018 11/22/2018 11/21/2018  WBC 4.0 - 10.5 K/uL 10.4 8.2 5.8  RBC 4.22 - 5.81 MIL/uL 5.17 4.88 4.76  HGB 13.0 - 17.0 g/dL 16.0 15.2 14.9  HCT 39.0 - 52.0 % 48.2 44.9 44.2  PLT 150 - 400 K/uL 102(L) 87(L) 95(L)  NEUTROABS 1.7 - 7.7 K/uL - - 4.1  LYMPHSABS 0.7 - 4.0 K/uL - - 1.1     Body mass index is 29.29 kg/m.  Orders:  Orders Placed This Encounter  Procedures  . XR Shoulder Right  . XR Lumbar Spine 2-3 Views   Meds ordered this encounter  Medications  . predniSONE (DELTASONE) 50 MG tablet    Sig: Take one tablet by mouth once daily for 5 days.    Dispense:  5  tablet    Refill:  0     Procedures: No procedures performed  Clinical Data: No additional findings.  ROS:  All other systems negative, except as noted in the HPI. Review of Systems  Constitutional: Negative for chills and fever.  Musculoskeletal: Positive for arthralgias and myalgias.    Objective: Vital Signs: Ht _0  (1.702 m)   Wt 187 lb (84.8 kg)   BMI 29.29 kg/m   Specialty Comments:  No specialty comments available.  PMFS History: Patient Active Problem List   Diagnosis Date Noted  . Chest pain of uncertain etiology   . Elevated troponin   . NSTEMI (non-ST elevated myocardial infarction) ([place]) 11/21/2018  . Cervicalgia 10/21/2017  . Chronic midline low back pain without sciatica 10/21/2017  . Impingement syndrome of right shoulder 05/09/2016  . Mixed hyperlipidemia 02/29/2016  . Acute lower GI bleeding 06/12/2015  . History  of low anterior resection of rectum 06/12/2015  . Hypokalemia 06/12/2015  . Hypophosphatemia 06/12/2015  . Leukocytosis 06/12/2015  . Acute blood loss anemia 06/12/2015  . Postoperative ileus ([place]) 06/05/2015  . Cholelithiasis 05/29/2015  . Diverticulitis of rectosigmoid 05/29/2015  . GERD (gastroesophageal reflux disease) 05/29/2015  . 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 Bronchiectasis with acute exacerbation ([place]) 11/17/2012  . Bronchiectasis without acute exacerbation ([place]) 06/21/2010  . Hyperlipidemia 05/14/2010  . Essential hypertension 05/14/2010  . MYOCARDIAL INFARCTION 05/14/2010  . TIA (transient ischemic attack) 05/14/2010   Past Medical History:  Diagnosis Date  . Acute blood loss anemia 06/12/2015  . Acute lower GI bleeding 06/12/2015  . Bronchiectasis with acute exacerbation ([place]) 11/17/2012  . Bronchiectasis without acute exacerbation ([place]) 06/21/2010   Ct chest 2011:  bilat lower lobe bronchiectasis with tiny nodular infiltrates scattered in the area (?MAC colonization) Spirometry 2012:  No airflow obstruction Spirometry 2014:  Normal.  [name] 2016:  Normal flows.    . Cervicalgia 10/21/2017  . Cholelithiasis 05/29/2015  . Chronic midline low back pain without sciatica 10/21/2017  . Coronary artery disease involving native coronary artery with angina pectoris ([place]) 11/14/2014   Overview:  1.Taxus Stent to the LAD 06/30/07 with patent previous LAD stents  2. Cath 05/21/2011 without restenosis or obstructive stenosis 3. Cath 03/04/13 with patent stent, EF 55-60%.  . CVA (cerebral vascular accident) ([place])   . Diverticulitis of rectosigmoid 05/29/2015  . Essential hypertension 05/14/2010   Qualifier: Diagnosis of  By: Charma Igo    . GERD (gastroesophageal reflux disease)    s/p esophageal dilation for stricture  . 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 Hyperlipidemia   . Hypertension   . Hypokalemia  06/12/2015  . Hypophosphatemia 06/12/2015  . Impingement syndrome of right shoulder 05/09/2016  . Leukocytosis 06/12/2015  . Mixed hyperlipidemia 02/29/2016   Overview:  Added automatically from request for surgery [id number]  . MYOCARDIAL INFARCTION 05/14/2010   Qualifier: History of  By: Charma Igo    . Myocardial infarction ([place])    +Sees [facility]. +CAD -recent nuclear scan with EF 40%, ?ischemia  . Postoperative ileus ([place]) 06/05/2015  . TIA (transient ischemic attack) 05/14/2010   Qualifier: Diagnosis of  By: Charma Igo      Family History  Problem Relation Age of Onset  . Allergies Mother   . Hypertension Mother   . Rheum arthritis Mother     Past Surgical History:  Procedure Laterality Date  . CORONARY STENT PLACEMENT     x 3  .  LEFT HEART CATH AND CORONARY ANGIOGRAPHY N/A 11/23/2018   Procedure: LEFT HEART CATH AND CORONARY ANGIOGRAPHY;  Surgeon: Burnell Blanks, MD;  Location: [facility];  Service: Cardiovascular;  Laterality: N/A;  . ROTATOR CUFF REPAIR     right  . TOTAL HIP ARTHROPLASTY     x 2   Social History   Occupational History  . 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 Smoking status: Former Smoker    Years: 10.00    Types: Cigars    Quit date: 03/25/1988    Years since quitting: 31.1  . Smokeless tobacco: Former Systems developer    Types: Chew  . Tobacco comment: Smoked 6 cigars a day  Substance and Sexual Activity  . Alcohol use: Yes    Comment: occ  . Drug use: Not Currently  . Sexual activity: Not on file

## 2019-05-13 ENCOUNTER — Ambulatory Visit: Payer: Medicare Other | Admitting: Orthopedic Surgery

## 2019-05-26 ENCOUNTER — Other Ambulatory Visit: Payer: Self-pay

## 2019-05-26 ENCOUNTER — Ambulatory Visit: Payer: Self-pay

## 2019-05-26 ENCOUNTER — Ambulatory Visit (INDEPENDENT_AMBULATORY_CARE_PROVIDER_SITE_OTHER): Payer: Medicare Other | Admitting: Family

## 2019-05-26 ENCOUNTER — Encounter: Payer: Self-pay | Admitting: Family

## 2019-05-26 VITALS — Ht 67.0 in | Wt 187.0 lb

## 2019-05-26 DIAGNOSIS — M25511 Pain in right shoulder: Secondary | ICD-10-CM

## 2019-05-26 DIAGNOSIS — M19011 Primary osteoarthritis, right shoulder: Secondary | ICD-10-CM | POA: Diagnosis not present

## 2019-05-26 DIAGNOSIS — G8929 Other chronic pain: Secondary | ICD-10-CM

## 2019-05-26 MED ORDER — OXYCODONE HCL 5 MG PO CAPS: 5.0000 mg | ORAL_CAPSULE | Freq: Four times a day (QID) | ORAL | 0 refills | Status: DC | PRN

## 2019-05-26 NOTE — Progress Notes (Signed)
Subjective: Patient is here for ultrasound-guided intra-articular right glenohumeral injection.  Chronic pain and decreased ROM.  Objective:  Pain with overhead reach.  Procedure: Ultrasound-guided right glenohumeral injection: After sterile prep with Betadine, injected 8 cc 1% lidocaine without epinephrine and 40 mg methylprednisolone using a 22-gauge spinal needle, passing the needle through approach into the glenohumeral joint.  Injectate seen filling joint capsule.

## 2019-05-28 ENCOUNTER — Encounter: Payer: Self-pay | Admitting: Family

## 2019-05-28 DIAGNOSIS — M19011 Primary osteoarthritis, right shoulder: Secondary | ICD-10-CM | POA: Insufficient documentation

## 2019-05-28 HISTORY — DX: Primary osteoarthritis, right shoulder: M19.011

## 2019-05-28 NOTE — Progress Notes (Signed)
Office Visit Note   Patient: Gary Diaz           Date of Birth: 10/01/1955           MRN: [id number] Visit Date: 05/26/2019              Requested by: Rogene Houston, MD [address],  [place] [address] PCP: Rogene Houston, MD  Chief Complaint  Patient presents with  . Right Shoulder - Follow-up    S/p injection 05/12/19  . Lower Back - Follow-up      HPI: The patient is a presents in follow-up for continued right shoulder pain.  The patient has a history of impingement as well as arthritis he is status post a arthroscopy of the right shoulder states has had a biceps tendon tear as well.   He had a Depo-Medrol injection of the subacromial bursa on February 17 this provided him with moderate relief.  He states for the last 3 to 4 days it his pain has been gradually worsening he has been dealing with this pain for years.  The pain greatly decreases his range of motion difficulty above head and across body reaching.  States often has to use his left hand at assist his right to lift things up to chest level  Assessment & Plan: Visit Diagnoses:  1. Chronic right shoulder pain   2. Right shoulder pain, unspecified chronicity   3. Primary osteoarthritis, right shoulder     Plan: Have referred to Dr. Junius Roads for a glenohumeral Depo-Medrol injection of the right shoulder he will follow-up in the office as needed  Follow-Up Instructions: Return if symptoms worsen or fail to improve.   Left Shoulder Exam   Tenderness  Left shoulder tenderness location: global.  Range of Motion  Forward flexion: 90   Tests  Impingement: positive Drop arm: positive  Other  Erythema: absent Sensation: normal       Patient is alert, oriented, no adenopathy, well-dressed, normal affect, normal respiratory effort.  Imaging: No results found. No images are attached to the encounter.  Labs: No results found for: HGBA1C, ESRSEDRATE, CRP, LABURIC, REPTSTATUS, GRAMSTAIN, CULT,  LABORGA   Lab Results  Component Value Date   ALBUMIN 4.3 01/19/2019   ALBUMIN 3.7 11/27/2006    No results found for: MG No results found for: VD25OH  No results found for: PREALBUMIN CBC EXTENDED Latest Ref Rng & Units 11/23/2018 11/22/2018 11/21/2018  WBC 4.0 - 10.5 K/uL 10.4 8.2 5.8  RBC 4.22 - 5.81 MIL/uL 5.17 4.88 4.76  HGB 13.0 - 17.0 g/dL 16.0 15.2 14.9  HCT 39.0 - 52.0 % 48.2 44.9 44.2  PLT 150 - 400 K/uL 102(L) 87(L) 95(L)  NEUTROABS 1.7 - 7.7 K/uL - - 4.1  LYMPHSABS 0.7 - 4.0 K/uL - - 1.1     Body mass index is 29.29 kg/m.  Orders:  Orders Placed This Encounter  Procedures  . US Guided Needle Placement - No Linked Charges   Meds ordered this encounter  Medications  . oxycodone (OXY-IR) 5 MG capsule    Sig: Take 1 capsule (5 mg total) by mouth every 6 (six) hours as needed.    Dispense:  10 capsule    Refill:  0     Procedures: No procedures performed  Clinical Data: No additional findings.  ROS:  All other systems negative, except as noted in the HPI. Review of Systems  Constitutional: Negative for chills and fever.  Musculoskeletal: Positive for  arthralgias. Negative for joint swelling.  Neurological: Positive for weakness. Negative for numbness.    Objective: Vital Signs: Ht _0  (1.702 m)   Wt 187 lb (84.8 kg)   BMI 29.29 kg/m   Specialty Comments:  No specialty comments available.  PMFS History: Patient Active Problem List   Diagnosis Date Noted  . Primary osteoarthritis, right shoulder 05/28/2019  . Chest pain of uncertain etiology   . Elevated troponin   . NSTEMI (non-ST elevated myocardial infarction) ([place]) 11/21/2018  . Cervicalgia 10/21/2017  . Chronic midline low back pain without sciatica 10/21/2017  . Impingement syndrome of right shoulder 05/09/2016  . Mixed hyperlipidemia 02/29/2016  . Acute lower GI bleeding 06/12/2015  . 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 Hypokalemia 06/12/2015  .  Hypophosphatemia 06/12/2015  . Leukocytosis 06/12/2015  . Acute blood loss anemia 06/12/2015  . Postoperative ileus ([place]) 06/05/2015  . Cholelithiasis 05/29/2015  . Diverticulitis of rectosigmoid 05/29/2015  . GERD (gastroesophageal reflux disease) 05/29/2015  . Coronary artery disease involving native coronary artery with angina pectoris ([place]) 11/14/2014  . Bronchiectasis with acute exacerbation ([place]) 11/17/2012  . Bronchiectasis without acute exacerbation ([place]) 06/21/2010  . Hyperlipidemia 05/14/2010  . Essential hypertension 05/14/2010  . MYOCARDIAL INFARCTION 05/14/2010  . TIA (transient ischemic attack) 05/14/2010   Past Medical History:  Diagnosis Date  . Acute blood loss anemia 06/12/2015  . Acute lower GI bleeding 06/12/2015  . Bronchiectasis with acute exacerbation ([place]) 11/17/2012  . Bronchiectasis without acute exacerbation ([place]) 06/21/2010   Ct chest 2011:  bilat lower lobe bronchiectasis with tiny nodular infiltrates scattered in the area (?MAC colonization) Spirometry 2012:  No airflow obstruction Spirometry 2014:  Normal.  [name] 2016:  Normal flows.    . Cervicalgia 10/21/2017  . Cholelithiasis 05/29/2015  . Chronic midline low back pain without sciatica 10/21/2017  . Coronary artery disease involving native coronary artery with angina pectoris ([place]) 11/14/2014   Overview:  1.Taxus Stent to the LAD 06/30/07 with patent previous LAD stents  2. Cath 05/21/2011 without restenosis or obstructive stenosis 3. Cath 03/04/13 with patent stent, EF 55-60%.  . CVA (cerebral vascular accident) ([place])   . Diverticulitis of rectosigmoid 05/29/2015  . Essential hypertension 05/14/2010   Qualifier: Diagnosis of  By: Charma Igo    . GERD (gastroesophageal reflux disease)    s/p esophageal dilation for stricture  . 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 Hyperlipidemia   . Hypertension   . Hypokalemia 06/12/2015  . Hypophosphatemia 06/12/2015  . Impingement syndrome of right  shoulder 05/09/2016  . Leukocytosis 06/12/2015  . Mixed hyperlipidemia 02/29/2016   Overview:  Added automatically from request for surgery [id number]  . MYOCARDIAL INFARCTION 05/14/2010   Qualifier: History of  By: Charma Igo    . Myocardial infarction ([place])    +Sees [facility]. +CAD -recent nuclear scan with EF 40%, ?ischemia  . Postoperative ileus ([place]) 06/05/2015  . TIA (transient ischemic attack) 05/14/2010   Qualifier: Diagnosis of  By: Charma Igo      Family History  Problem Relation Age of Onset  . Allergies Mother   . Hypertension Mother   . Rheum arthritis Mother     Past Surgical History:  Procedure Laterality Date  . CORONARY STENT PLACEMENT     x 3  . LEFT HEART CATH AND CORONARY ANGIOGRAPHY N/A 11/23/2018   Procedure: LEFT HEART CATH AND CORONARY ANGIOGRAPHY;  Surgeon: Burnell Blanks, MD;  Location: [facility];  Service: Cardiovascular;  Laterality: N/A;  . ROTATOR CUFF REPAIR     right  . TOTAL HIP ARTHROPLASTY     x 2   Social History   Occupational History  . 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 Smoking status: Former Smoker    Years: 10.00    Types: Cigars    Quit date: 03/25/1988    Years since quitting: 31.1  . Smokeless tobacco: Former Systems developer    Types: Chew  . Tobacco comment: Smoked 6 cigars a day  Substance and Sexual Activity  . Alcohol use: Yes    Comment: occ  . Drug use: Not Currently  . Sexual activity: Not on file

## 2020-02-07 DIAGNOSIS — I639 Cerebral infarction, unspecified: Secondary | ICD-10-CM | POA: Insufficient documentation

## 2020-02-07 DIAGNOSIS — I219 Acute myocardial infarction, unspecified: Secondary | ICD-10-CM | POA: Insufficient documentation

## 2020-02-07 DIAGNOSIS — I10 Essential (primary) hypertension: Secondary | ICD-10-CM | POA: Insufficient documentation

## 2020-02-09 NOTE — Progress Notes (Signed)
Cardiology Office Note:    Date:  02/10/2020   ID:  Gary Diaz, DOB 1956-02-25, MRN [id number]  PCP:  Dustin Flock, PA-C  Cardiologist:  Shirlee More, MD    Referring MD: Rogene Houston, MD    ASSESSMENT:    1. Coronary artery disease involving native coronary artery of native heart with angina pectoris ([place])   2. Essential hypertension   3. Mixed hyperlipidemia   4. LV dysfunction    PLAN:    In order of problems listed above:  1. He has done well in the last year CAD is stable no angina on current medical therapy New [place] Heart Association class I he will continue clopidogrel beta-blocker oral nitrate and ranolazine along with lipid-lowering therapy. He does not require an ischemia evaluation. 2. BP at target continue current treatment 3. Continue his statin we will request records for his lipid profile liver function 4. Recheck echocardiogram if EF is less than 40 we need to consider Entresto   Next appointment: 1 year   Medication Adjustments/Labs and Tests Ordered: Current medicines are reviewed at length with the patient today.  Concerns regarding medicines are outlined above.  No orders of the defined types were placed in this encounter.  No orders of the defined types were placed in this encounter.   Chief Complaint  Patient presents with  . Follow-up  . Coronary Artery Disease    History of Present Illness:    Gary Diaz is a 64 y.o. male with a hx of CAD with PCI and stent to left anterior descending coronary artery hypertension hyperlipidemia previous TIA.  Last seen 01/19/2019 following admission to [facility] 11/21/2018 with unstable angina and developed acute coronary syndrome with elevated high-sensitivity troponin.  Echocardiogram showed ejection fraction 40 to 45% with inferior and lateral hypokinesia.  He was referred to and underwent left heart catheterization 11/23/2018 showing patent stent LAD and showing mild nonobstructive  CAD in the right coronary artery and the left circumflex coronary vessel.  . Compliance with diet, lifestyle and medications: Yes  He has had a good year he has no angina. He has had some episodes of indigestion took 1 nitroglycerin at one time. No anginal discomfort shortness of breath palpitation or syncope. Labs are done with [facility] I will request a copy. He anticipates having total shoulder replacement on the right side. I told him I would stop clopidogrel 7 days prior to surgery and typically resume 1 to 2 days afterwards. We will do an echocardiogram to reassess his ejection fraction which was diminished last year and at this time I do not think he needs a repeat ischemia evaluation as he has stable CAD. Past Medical History:  Diagnosis Date  . Acute blood loss anemia 06/12/2015  . Acute lower GI bleeding 06/12/2015  . Bronchiectasis with acute exacerbation ([place]) 11/17/2012  . Bronchiectasis without acute exacerbation ([place]) 06/21/2010   Ct chest 2011:  bilat lower lobe bronchiectasis with tiny nodular infiltrates scattered in the area (?MAC colonization) Spirometry 2012:  No airflow obstruction Spirometry 2014:  Normal.  [name] 2016:  Normal flows.    . Cervicalgia 10/21/2017  . Cholelithiasis 05/29/2015  . Chronic midline low back pain without sciatica 10/21/2017  . Coronary artery disease involving native coronary artery with angina pectoris ([place]) 11/14/2014   Overview:  1.Taxus Stent to the LAD 06/30/07 with patent previous LAD stents  2. Cath 05/21/2011 without restenosis or obstructive stenosis 3. Cath 03/04/13 with patent stent,  EF 55-60%.  . CVA (cerebral vascular accident) ([place])   . Diverticulitis of rectosigmoid 05/29/2015  . Essential hypertension 05/14/2010   Qualifier: Diagnosis of  By: Charma Igo    . GERD (gastroesophageal reflux disease)    s/p esophageal dilation for stricture  . 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 Hyperlipidemia   .  Hypertension   . Hypokalemia 06/12/2015  . Hypophosphatemia 06/12/2015  . Impingement syndrome of right shoulder 05/09/2016  . Leukocytosis 06/12/2015  . Mixed hyperlipidemia 02/29/2016   Overview:  Added automatically from request for surgery [id number]  . MYOCARDIAL INFARCTION 05/14/2010   Qualifier: History of  By: Charma Igo    . Myocardial infarction ([place])    +Sees Japheth Diekman. +CAD -recent nuclear scan with EF 40%, ?ischemia  . Postoperative ileus ([place]) 06/05/2015  . TIA (transient ischemic attack) 05/14/2010   Qualifier: Diagnosis of  By: Charma Igo      Past Surgical History:  Procedure Laterality Date  . CORONARY STENT PLACEMENT     x 3  . LEFT HEART CATH AND CORONARY ANGIOGRAPHY N/A 11/23/2018   Procedure: LEFT HEART CATH AND CORONARY ANGIOGRAPHY;  Surgeon: Burnell Blanks, MD;  Location: [facility];  Service: Cardiovascular;  Laterality: N/A;  . ROTATOR CUFF REPAIR     right  . TOTAL HIP ARTHROPLASTY     x 2    Current Medications: Current Meds  Medication Sig  . albuterol (PROAIR HFA) 108 (90 Base) MCG/ACT inhaler Inhale 2 puffs into the lungs every 4 (four) hours as needed (shortness of breath).   Marland Kitchen atorvastatin (LIPITOR) 40 MG tablet Take 40 mg by mouth daily.   . carvedilol (COREG) 12.5 MG tablet Take 12.5 mg by mouth 2 (two) times daily with a meal.  . cetirizine (ZYRTEC) 10 MG tablet Take 10 mg by mouth daily.   . citalopram (CELEXA) 40 MG tablet Take 40 mg by mouth daily.  . clopidogrel (PLAVIX) 75 MG tablet Take 1 tablet (75 mg total) by mouth daily.  Marland Kitchen esomeprazole (NEXIUM) 40 MG capsule Take 40 mg by mouth 2 (two) times daily.  . fluticasone (FLONASE) 50 MCG/ACT nasal spray Place 2 sprays into both nostrils as needed for allergies or rhinitis.  Marland Kitchen isosorbide mononitrate (IMDUR) 60 MG 24 hr tablet Take 60 mg by mouth daily.  Marland Kitchen lactulose (CHRONULAC) 10 GM/15ML solution Take 30 g by mouth daily as needed for mild constipation or moderate constipation.   Marland Kitchen  linaclotide (LINZESS) 145 MCG CAPS capsule Take 145 mcg by mouth as needed (constipation).  . multivitamin (THERAGRAN) per tablet Take 1 tablet by mouth daily.    . nitroGLYCERIN (NITROSTAT) 0.4 MG SL tablet Place 0.4 mg under the tongue every 5 (five) minutes as needed.    . Olopatadine HCl (PATADAY) 0.2 % SOLN Place 1 drop into both eyes as needed (dry eye).   Marland Kitchen omeprazole (PRILOSEC) 40 MG capsule Take 40 mg by mouth 2 (two) times daily.   . Oxycodone HCl 10 MG TABS Take 10 mg by mouth every 6 (six) hours as needed.  . ranolazine (RANEXA) 500 MG 12 hr tablet Take 500 mg by mouth 2 (two) times daily.  . sildenafil (VIAGRA) 100 MG tablet Take 100 mg by mouth daily as needed for erectile dysfunction.  . tamsulosin (FLOMAX) 0.4 MG CAPS capsule Take 0.4 mg by mouth at bedtime.  Marland Kitchen testosterone cypionate (DEPOTESTOSTERONE CYPIONATE) 200 MG/ML injection Inject 200 mg into the muscle every 14 (fourteen)  days.      Allergies:   Latex   Social History   Socioeconomic History  . Marital status: Divorced    Spouse name: Not on file  . Number of children: Not on file  . Years of education: Not on file  . Highest education level: Not on file  Occupational History  . 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 Smoking status: Former Smoker    Years: 10.00    Types: Cigars    Quit date: 03/25/1988    Years since quitting: 31.9  . Smokeless tobacco: Former Systems developer    Types: Chew  . Tobacco comment: Smoked 6 cigars a day  Vaping Use  . Vaping Use: Never used  Substance and Sexual Activity  . Alcohol use: Yes    Comment: occ  . Drug use: Not Currently  . Sexual activity: Not on file  Other Topics Concern  . Not on file  Social History Narrative  . Not on file   Social Determinants of Health   Financial Resource Strain:   . Difficulty of Paying Living Expenses: Not on file  Food Insecurity:   . Worried About Charity fundraiser in  the Last Year: Not on file  . Ran Out of Food in the Last Year: Not on file  Transportation Needs:   . Lack of Transportation (Medical): Not on file  . Lack of Transportation (Non-Medical): Not on file  Physical Activity:   . Days of Exercise per Week: Not on file  . Minutes of Exercise per Session: Not on file  Stress:   . Feeling of Stress : Not on file  Social Connections:   . Frequency of Communication with Friends and Family: Not on file  . Frequency of Social Gatherings with Friends and Family: Not on file  . Attends Religious Services: Not on file  . Active Member of Clubs or Organizations: Not on file  . Attends Archivist Meetings: Not on file  . Marital Status: Not on file     Family History: The patient's family history includes Allergies in his mother; Hypertension in his mother; Rheum arthritis in his mother. ROS:   Please see the history of present illness.    All other systems reviewed and are negative.  EKGs/Labs/Other Studies Reviewed:    The following studies were reviewed today:  EKG:  EKG ordered today and personally reviewed.  The ekg ordered today demonstrates sinus rhythm's EKG is low voltage otherwise normal  Recent Labs: No results found for requested labs within last 8760 hours.  Recent Lipid Panel    Component Value Date/Time   CHOL 182 01/19/2019 1117   TRIG 234 (H) 01/19/2019 1117   HDL 50 01/19/2019 1117   CHOLHDL 3.6 01/19/2019 1117   CHOLHDL 3.7 11/22/2018 0347   VLDL 28 11/22/2018 0347   LDLCALC 93 01/19/2019 1117    Physical Exam:    VS:  BP 113/68   Pulse 69   Ht _0  (1.702 m)   Wt 191 lb 6.4 oz (86.8 kg)   SpO2 97%   BMI 29.98 kg/m     Wt Readings from Last 3 Encounters:  02/10/20 191 lb 6.4 oz (86.8 kg)  05/26/19 187 lb (84.8 kg)  05/12/19 187 lb (84.8 kg)     GEN:  Well nourished, well developed in no acute distress HEENT: Normal NECK: No JVD; No carotid bruits LYMPHATICS:  No lymphadenopathy CARDIAC:  RRR, no murmurs, rubs, gallops RESPIRATORY:  Clear to auscultation without rales, wheezing or rhonchi  ABDOMEN: Soft, non-tender, non-distended MUSCULOSKELETAL:  No edema; No deformity  SKIN: Warm and dry NEUROLOGIC:  Alert and oriented x 3 PSYCHIATRIC:  Normal affect    Signed, Shirlee More, MD  02/10/2020 11:39 AM    [place]

## 2020-02-10 ENCOUNTER — Encounter: Payer: Self-pay | Admitting: Cardiology

## 2020-02-10 ENCOUNTER — Ambulatory Visit (INDEPENDENT_AMBULATORY_CARE_PROVIDER_SITE_OTHER): Payer: Medicare Other | Admitting: Cardiology

## 2020-02-10 ENCOUNTER — Other Ambulatory Visit: Payer: Self-pay

## 2020-02-10 VITALS — BP 113/68 | HR 69 | Ht 67.0 in | Wt 191.4 lb

## 2020-02-10 DIAGNOSIS — I25119 Atherosclerotic heart disease of native coronary artery with unspecified angina pectoris: Secondary | ICD-10-CM | POA: Diagnosis not present

## 2020-02-10 DIAGNOSIS — E782 Mixed hyperlipidemia: Secondary | ICD-10-CM | POA: Diagnosis not present

## 2020-02-10 DIAGNOSIS — I519 Heart disease, unspecified: Secondary | ICD-10-CM

## 2020-02-10 DIAGNOSIS — I10 Essential (primary) hypertension: Secondary | ICD-10-CM

## 2020-02-10 NOTE — Patient Instructions (Signed)
Medication Instructions:  Your physician recommends that you continue on your current medications as directed. Please refer to the Current Medication list given to you today.  *If you need a refill on your cardiac medications before your next appointment, please call your pharmacy*   Lab Work: None If you have labs (blood work) drawn today and your tests are completely normal, you will receive your results only by: MyChart Message (if you have MyChart) OR A paper copy in the mail If you have any lab test that is abnormal or we need to change your treatment, we will call you to review the results.   Testing/Procedures: Your physician has requested that you have an echocardiogram. Echocardiography is a painless test that uses sound waves to create images of your heart. It provides your doctor with information about the size and shape of your heart and how well your heart's chambers and valves are working. This procedure takes approximately one hour. There are no restrictions for this procedure.    Follow-Up: At [facility], you and your health needs are our priority.  As part of our continuing mission to provide you with exceptional heart care, we have created designated Provider Care Teams.  These Care Teams include your primary Cardiologist (physician) and Advanced Practice Providers (APPs -  Physician Assistants and Nurse Practitioners) who all work together to provide you with the care you need, when you need it.  We recommend signing up for the patient portal called "MyChart".  Sign up information is provided on this After Visit Summary.  MyChart is used to connect with patients for Virtual Visits (Telemedicine).  Patients are able to view lab/test results, encounter notes, upcoming appointments, etc.  Non-urgent messages can be sent to your provider as well.   To learn more about what you can do with MyChart, go to [email or web address].com.    Your next appointment:   1 year(s)  The  format for your next appointment:   In Person  Provider:   Brian Munley, MD   Other Instructions   

## 2020-03-09 ENCOUNTER — Ambulatory Visit (INDEPENDENT_AMBULATORY_CARE_PROVIDER_SITE_OTHER): Payer: Medicare Other

## 2020-03-09 ENCOUNTER — Other Ambulatory Visit: Payer: Self-pay

## 2020-03-09 DIAGNOSIS — I25119 Atherosclerotic heart disease of native coronary artery with unspecified angina pectoris: Secondary | ICD-10-CM

## 2020-03-09 LAB — ECHOCARDIOGRAM COMPLETE
Area-P 1/2: 3.95 cm2
P 1/2 time: 533 msec
S' Lateral: 3.2 cm

## 2020-03-09 NOTE — Progress Notes (Signed)
Complete echocardiogram performed.  Jimmy Orange Hilligoss RDCS, RVT  

## 2020-03-10 ENCOUNTER — Telehealth: Payer: Self-pay | Admitting: Cardiology

## 2020-03-10 ENCOUNTER — Telehealth: Payer: Self-pay

## 2020-03-10 NOTE — Telephone Encounter (Signed)
Spoke with patient regarding results and recommendation.  Patient verbalizes understanding and is agreeable to plan of care. Advised patient to call back with any issues or concerns.  

## 2020-03-10 NOTE — Telephone Encounter (Signed)
Patient is calling back to get results 

## 2020-03-10 NOTE — Telephone Encounter (Signed)
Left message on patients voicemail to please return our call.   

## 2020-03-10 NOTE — Telephone Encounter (Signed)
-----   Message from Baldo Daub, MD sent at 03/09/2020  5:10 PM EST ----- Good result his heart muscle function is recovered to normal no change in treatment

## 2020-03-10 NOTE — Telephone Encounter (Signed)
Please call back patient's sister, Doren Custard at [phone]

## 2020-04-11 NOTE — H&P (Signed)
Patient's anticipated LOS is less than 2 midnights, meeting these requirements: - Younger than 74 - Lives within 1 hour of care - Has a competent adult at home to recover with post-op recover - NO history of  - Chronic pain requiring opiods  - Diabetes  - Coronary Artery Disease  - Heart failure  - Heart attack  - Stroke  - DVT/VTE  - Cardiac arrhythmia  - Respiratory Failure/COPD  - Renal failure  - Anemia  - Advanced Liver disease       Gary Diaz is an 65 y.o. male.    Chief Complaint: left shoulder pain  HPI: Pt is a 65 y.o. male complaining of left shoulder pain for multiple years. Pain had continually increased since the beginning. X-rays in the clinic show end-stage arthritic changes of the left shoulder. Pt has tried various conservative treatments which have failed to alleviate their symptoms, including injections and therapy. Various options are discussed with the patient. Risks, benefits and expectations were discussed with the patient. Patient understand the risks, benefits and expectations and wishes to proceed with surgery.   PCP:  Dustin Flock, PA-C  D/C Plans: Home  PMH: Past Medical History:  Diagnosis Date  . Acute blood loss anemia 06/12/2015  . Acute lower GI bleeding 06/12/2015  . Bronchiectasis with acute exacerbation ([place]) 11/17/2012  . Bronchiectasis without acute exacerbation ([place]) 06/21/2010   Ct chest 2011:  bilat lower lobe bronchiectasis with tiny nodular infiltrates scattered in the area (?MAC colonization) Spirometry 2012:  No airflow obstruction Spirometry 2014:  Normal.  [name] 2016:  Normal flows.    . Cervicalgia 10/21/2017  . Cholelithiasis 05/29/2015  . Chronic midline low back pain without sciatica 10/21/2017  . Coronary artery disease involving native coronary artery with angina pectoris ([place]) 11/14/2014   Overview:  1.Taxus Stent to the LAD 06/30/07 with patent previous LAD stents  2. Cath 05/21/2011 without restenosis or  obstructive stenosis 3. Cath 03/04/13 with patent stent, EF 55-60%.  . CVA (cerebral vascular accident) ([place])   . Diverticulitis of rectosigmoid 05/29/2015  . Essential hypertension 05/14/2010   Qualifier: Diagnosis of  By: Charma Igo    . GERD (gastroesophageal reflux disease)    s/p esophageal dilation for stricture  . 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 Hyperlipidemia   . Hypertension   . Hypokalemia 06/12/2015  . Hypophosphatemia 06/12/2015  . Impingement syndrome of right shoulder 05/09/2016  . Leukocytosis 06/12/2015  . Mixed hyperlipidemia 02/29/2016   Overview:  Added automatically from request for surgery [id number]  . MYOCARDIAL INFARCTION 05/14/2010   Qualifier: History of  By: Charma Igo    . Myocardial infarction ([place])    +Sees [facility]. +CAD -recent nuclear scan with EF 40%, ?ischemia  . Postoperative ileus ([place]) 06/05/2015  . TIA (transient ischemic attack) 05/14/2010   Qualifier: Diagnosis of  By: Charma Igo      PSH: Past Surgical History:  Procedure Laterality Date  . CORONARY STENT PLACEMENT     x 3  . LEFT HEART CATH AND CORONARY ANGIOGRAPHY N/A 11/23/2018   Procedure: LEFT HEART CATH AND CORONARY ANGIOGRAPHY;  Surgeon: Burnell Blanks, MD;  Location: [facility];  Service: Cardiovascular;  Laterality: N/A;  . ROTATOR CUFF REPAIR     right  . TOTAL HIP ARTHROPLASTY     x 2    Social History:  reports that he quit smoking about 32 years ago. His smoking use included cigars. He quit after  10.00 years of use. He has quit using smokeless tobacco.  His smokeless tobacco use included chew. He reports current alcohol use. He reports previous drug use.  Allergies:  Allergies  Allergen Reactions  . Latex     REACTION: blisters REACTION: blisters    Medications: No current facility-administered medications for this encounter.   Current Outpatient Medications  Medication Sig Dispense Refill  . albuterol (PROAIR HFA) 108 (90 Base)  MCG/ACT inhaler Inhale 2 puffs into the lungs every 4 (four) hours as needed (shortness of breath).     Marland Kitchen aspirin EC 81 MG EC tablet Take 1 tablet (81 mg total) by mouth daily. (Patient not taking: Reported on 02/10/2020)    . atorvastatin (LIPITOR) 40 MG tablet Take 40 mg by mouth daily.     . carvedilol (COREG) 12.5 MG tablet Take 12.5 mg by mouth 2 (two) times daily with a meal.    . cetirizine (ZYRTEC) 10 MG tablet Take 10 mg by mouth daily.     . citalopram (CELEXA) 40 MG tablet Take 40 mg by mouth daily.    . clopidogrel (PLAVIX) 75 MG tablet Take 1 tablet (75 mg total) by mouth daily. 90 tablet 1  . docusate sodium (COLACE) 50 MG capsule Take 50 mg by mouth daily as needed for mild constipation.    Marland Kitchen esomeprazole (NEXIUM) 40 MG capsule Take 40 mg by mouth 2 (two) times daily.    . fluticasone (FLONASE) 50 MCG/ACT nasal spray Place 2 sprays into both nostrils as needed for allergies or rhinitis.    Marland Kitchen isosorbide mononitrate (IMDUR) 60 MG 24 hr tablet Take 60 mg by mouth daily.    Marland Kitchen lactulose (CHRONULAC) 10 GM/15ML solution Take 30 g by mouth daily as needed for mild constipation or moderate constipation.   11  . linaclotide (LINZESS) 145 MCG CAPS capsule Take 145 mcg by mouth as needed (constipation).    . multivitamin (THERAGRAN) per tablet Take 1 tablet by mouth daily.      . nitroGLYCERIN (NITROSTAT) 0.4 MG SL tablet Place 0.4 mg under the tongue every 5 (five) minutes as needed.      . Olopatadine HCl (PATADAY) 0.2 % SOLN Place 1 drop into both eyes as needed (dry eye).     Marland Kitchen omeprazole (PRILOSEC) 40 MG capsule Take 40 mg by mouth 2 (two) times daily.     . Oxycodone HCl 10 MG TABS Take 10 mg by mouth every 6 (six) hours as needed.    . ranolazine (RANEXA) 500 MG 12 hr tablet Take 500 mg by mouth 2 (two) times daily.    . sildenafil (VIAGRA) 100 MG tablet Take 100 mg by mouth daily as needed for erectile dysfunction.    . tamsulosin (FLOMAX) 0.4 MG CAPS capsule Take 0.4 mg by mouth at  bedtime.    Marland Kitchen testosterone cypionate (DEPOTESTOSTERONE CYPIONATE) 200 MG/ML injection Inject 200 mg into the muscle every 14 (fourteen) days.   4    No results found for this or any previous visit (from the past 48 hour(s)). No results found.  ROS: Pain with rom of the left upper extremity  Physical Exam: Alert and oriented 65 y.o. male in no acute distress Cranial nerves 2-12 intact Cervical spine: full rom with no tenderness, nv intact distally Chest: active breath sounds bilaterally, no wheeze rhonchi or rales Heart: regular rate and rhythm, no murmur Abd: non tender non distended with active bowel sounds Hip is stable with rom  Left shoulder with  weak and painful rom nv intact distally   Assessment/Plan Assessment: left shoulder cuff arthropathy  Plan:  Patient will undergo a left reverse total shoulder by Dr. Veverly Fells at [facility] benefits and expectations were discussed with the patient. Patient understand risks, benefits and expectations and wishes to proceed. Preoperative templating of the joint replacement has been completed, documented, and submitted to the Operating Room personnel in order to optimize intra-operative equipment management.   Merla Riches PA-C, MPAS [facility] is now [facility] [address] [place], [place], [place] [address] Phone: [phone] [email or web address] Facebook  [employer]

## 2020-04-18 NOTE — Progress Notes (Signed)
DUE TO COVID-19 ONLY ONE VISITOR IS ALLOWED TO COME WITH YOU AND STAY IN THE WAITING ROOM ONLY DURING PRE OP AND PROCEDURE DAY OF SURGERY. THE 1 VISITOR  MAY VISIT WITH YOU AFTER SURGERY IN YOUR PRIVATE ROOM DURING VISITING HOURS ONLY!  YOU NEED TO HAVE A COVID 19 TEST ON_2/03/2020 ______ @_______ , THIS TEST MUST BE DONE BEFORE SURGERY,  COVID TESTING SITE [address] [place] , IT IS ON THE RIGHT GOING OUT [address] APPROXIMATELY  2 MINUTES PAST [employer] ON THE RIGHT. ONCE YOUR COVID TEST IS COMPLETED,  PLEASE BEGIN THE QUARANTINE INSTRUCTIONS AS OUTLINED IN YOUR HANDOUT.                DAMASO LADAY  04/18/2020   Your procedure is scheduled on: 04/28/2020    Report to [facility] Main  Entrance   Report to admitting at   0530 AM     Call this number if you have problems the morning of surgery [phone]    REMEMBER: NO  SOLID FOOD CANDY OR GUM AFTER MIDNIGHT. CLEAR LIQUIDS UNTIL   0430am       . NOTHING BY MOUTH EXCEPT CLEAR LIQUIDS UNTIL    . PLEASE FINISH ENSURE DRINK PER SURGEON ORDER  WHICH NEEDS TO BE COMPLETED AT     0430am .      CLEAR LIQUID DIET   Foods Allowed                                                                    Coffee and tea, regular and decaf                            Fruit ices (not with fruit pulp)                                      Iced Popsicles                                    Carbonated beverages, regular and diet                                    Cranberry, grape and apple juices Sports drinks like Gatorade Lightly seasoned clear broth or consume(fat free) Sugar, honey syrup ___________________________________________________________________      BRUSH YOUR TEETH MORNING OF SURGERY AND RINSE YOUR MOUTH OUT, NO CHEWING GUM CANDY OR MINTS.     Take these medicines the morning of surgery with A SIP OF WATER: ranexa, eye drops as usual, pirlosec, nexium, inhalers as usual and bring,  coreg, zyrtec, celexa   DO NOT TAKE ANY DIABETIC MEDICATIONS DAY OF YOUR SURGERY                               You may not have any metal on your body including hair pins and  piercings  Do not wear jewelry, make-up, lotions, powders or perfumes, deodorant             Do not wear nail polish on your fingernails.  Do not shave  48 hours prior to surgery.              Men may shave face and neck.   Do not bring valuables to the hospital. [place].  Contacts, dentures or bridgework may not be worn into surgery.  Leave suitcase in the car. After surgery it may be brought to your room.     Patients discharged the day of surgery will not be allowed to drive home. IF YOU ARE HAVING SURGERY AND GOING HOME THE SAME DAY, YOU MUST HAVE AN ADULT TO DRIVE YOU HOME AND BE WITH YOU FOR 24 HOURS. YOU MAY GO HOME BY TAXI OR UBER OR ORTHERWISE, BUT AN ADULT MUST ACCOMPANY YOU HOME AND STAY WITH YOU FOR 24 HOURS.  Name and phone number of your driver:  Special Instructions: N/A              Please read over the following fact sheets you were given: _____________________________________________________________________  [facility] - Preparing for Surgery Before surgery, you can play an important role.  Because skin is not sterile, your skin needs to be as free of germs as possible.  You can reduce the number of germs on your skin by washing with CHG (chlorahexidine gluconate) soap before surgery.  CHG is an antiseptic cleaner which kills germs and bonds with the skin to continue killing germs even after washing. Please DO NOT use if you have an allergy to CHG or antibacterial soaps.  If your skin becomes reddened/irritated stop using the CHG and inform your nurse when you arrive at Short Stay. Do not shave (including legs and underarms) for at least 48 hours prior to the first CHG shower.  You may shave your face/neck. Please follow these  instructions carefully:  1.  Shower with CHG Soap the night before surgery and the  morning of Surgery.  2.  If you choose to wash your hair, wash your hair first as usual with your  normal  shampoo.  3.  After you shampoo, rinse your hair and body thoroughly to remove the  shampoo.                           4.  Use CHG as you would any other liquid soap.  You can apply chg directly  to the skin and wash                       Gently with a scrungie or clean washcloth.  5.  Apply the CHG Soap to your body ONLY FROM THE NECK DOWN.   Do not use on face/ open                           Wound or open sores. Avoid contact with eyes, ears mouth and genitals (private parts).                       Wash face,  Genitals (private parts) with your normal soap.             6.  Wash thoroughly, paying special attention to the area where your surgery  will be performed.  7.  Thoroughly rinse your body with warm water from the neck down.  8.  DO NOT shower/wash with your normal soap after using and rinsing off  the CHG Soap.                9.  Pat yourself dry with a clean towel.            10.  Wear clean pajamas.            11.  Place clean sheets on your bed the night of your first shower and do not  sleep with pets. Day of Surgery : Do not apply any lotions/deodorants the morning of surgery.  Please wear clean clothes to the hospital/surgery center.  FAILURE TO FOLLOW THESE INSTRUCTIONS MAY RESULT IN THE CANCELLATION OF YOUR SURGERY PATIENT SIGNATURE_________________________________  NURSE SIGNATURE__________________________________  ________________________________________________________________________             [facility]- Preparing for Total Shoulder Arthroplasty    Before surgery, you can play an important role. Because skin is not sterile, your skin needs to be as free of germs as possible. You can reduce the number of germs on your skin by using the following products. . Benzoyl Peroxide  Gel o Reduces the number of germs present on the skin o Applied twice a day to shoulder area starting two days before surgery    ==================================================================  Please follow these instructions carefully:  BENZOYL PEROXIDE 5% GEL  Please do not use if you have an allergy to benzoyl peroxide.   If your skin becomes reddened/irritated stop using the benzoyl peroxide.  Starting two days before surgery, apply as follows: 1. Apply benzoyl peroxide in the morning and at night. Apply after taking a shower. If you are not taking a shower clean entire shoulder front, back, and side along with the armpit with a clean wet washcloth.  2. Place a quarter-sized dollop on your shoulder and rub in thoroughly, making sure to cover the front, back, and side of your shoulder, along with the armpit.   2 days before ____ AM   ____ PM              1 day before ____ AM   ____ PM                         3. Do this twice a day for two days.  (Last application is the night before surgery, AFTER using the CHG soap as described below).  4. Do NOT apply benzoyl peroxide gel on the day of surgery.

## 2020-04-19 ENCOUNTER — Encounter (HOSPITAL_COMMUNITY)
Admission: RE | Admit: 2020-04-19 | Discharge: 2020-04-19 | Disposition: A | Discharge status: Home / Self Care | Payer: Medicare Other | Source: Ambulatory Visit | Attending: Physician Assistant | Admitting: Physician Assistant

## 2020-04-19 NOTE — Patient Instructions (Addendum)
DUE TO COVID-19 ONLY ONE VISITOR IS ALLOWED TO COME WITH YOU AND STAY IN THE WAITING ROOM ONLY DURING PRE OP AND PROCEDURE DAY OF SURGERY. THE 1 VISITOR  MAY VISIT WITH YOU AFTER SURGERY IN YOUR PRIVATE ROOM DURING VISITING HOURS ONLY!  YOU NEED TO HAVE A COVID 19 TEST ON_2/1______ @_11 :15______, THIS TEST MUST BE DONE BEFORE SURGERY,  COVID TESTING SITE [address] [place] , IT IS ON THE RIGHT GOING OUT [address] APPROXIMATELY  2 MINUTES PAST [employer] ON THE RIGHT. ONCE YOUR COVID TEST IS COMPLETED,  PLEASE BEGIN THE QUARANTINE INSTRUCTIONS AS OUTLINED IN YOUR HANDOUT.                Gary Diaz    Your procedure is scheduled on: 04/28/20   Report to [facility] Main  Entrance   Report to short stay at 5:50 AM     Call this number if you have problems the morning of surgery [phone]     BRUSH YOUR TEETH MORNING OF SURGERY AND RINSE YOUR MOUTH OUT, NO CHEWING GUM CANDY OR MINTS.   No food after midnight.    You may have clear liquid until 4:30 AM.    At 4:00 AM drink pre surgery drink.   Nothing by mouth after 4:30 AM.   Take these medicines the morning of surgery with A SIP OF WATER: Carvedilol, Imdur, Ranlazine, Omeprazole,               Use your inhaler if needed and bring it with you to the hospital                                 You may not have any metal on your body including              piercings  Do not wear jewelry, lotions, powders or deodorant                        Men may shave face and neck.   Do not bring valuables to the hospital. [facility] IS NOT             RESPONSIBLE   FOR VALUABLES.  Contacts, dentures or bridgework may not be worn into surgery.    _____________________________________________________________________             [facility]- Preparing for Total Shoulder Arthroplasty    Before surgery, you can play an important role. Because skin is not sterile, your skin needs to be  as free of germs as possible. You can reduce the number of germs on your skin by using the following products.  Benzoyl Peroxide Gel o Reduces the number of germs present on the skin o Applied twice a day to shoulder area starting two days before surgery    ==================================================================  Please follow these instructions carefully:  BENZOYL PEROXIDE 5% GEL  Please do not use if you have an allergy to benzoyl peroxide.   If your skin becomes reddened/irritated stop using the benzoyl peroxide.  Starting two days before surgery, apply as follows: 1. Apply benzoyl peroxide in the morning and at night. Apply after taking a shower. If you are not taking a shower clean entire shoulder front, back, and side along with the armpit with a clean wet washcloth.  2. Place a quarter-sized dollop on your shoulder and rub  in thoroughly, making sure to cover the front, back, and side of your shoulder, along with the armpit.   2 days before ____ AM   ____ PM              1 day before ____ AM   ____ PM                         3. Do this twice a day for two days.  (Last application is the night before surgery, AFTER using the CHG soap as described below).  4. Do NOT apply benzoyl peroxide gel on the day of surgery. 5.  [facility] - Preparing for Surgery Before surgery, you can play an important role.   Because skin is not sterile, your skin needs to be as free of germs as possible.  You can reduce the number of germs on your skin by washing with CHG (chlorahexidine gluconate) soap before surgery.   CHG is an antiseptic cleaner which kills germs and bonds with the skin to continue killing germs even after washing. Please DO NOT use if you have an allergy to CHG or antibacterial soaps .  If your skin becomes reddened/irritated stop using the CHG and inform your nurse when you arrive at Short Stay . You may shave your face/neck.  Please follow these instructions  carefully:  1.  Shower with CHG Soap the night before surgery and the  morning of Surgery.  2.  If you choose to wash your hair, wash your hair first as usual with your  normal  shampoo.  3.  After you shampoo, rinse your hair and body thoroughly to remove the  shampoo.                                        4.  Use CHG as you would any other liquid soap.  You can apply chg directly  to the skin and wash                       Gently with a scrungie or clean washcloth.  5.  Apply the CHG Soap to your body ONLY FROM THE NECK DOWN.   Do not use on face/ open                           Wound or open sores. Avoid contact with eyes, ears mouth and genitals (private parts).                       Wash face,  Genitals (private parts) with your normal soap.             6.  Wash thoroughly, paying special attention to the area where your surgery  will be performed.  7.  Thoroughly rinse your body with warm water from the neck down.  8.  DO NOT shower/wash with your normal soap after using and rinsing off  the CHG Soap.             9.  Pat yourself dry with a clean towel.            10.  Wear clean pajamas.            11.  Place clean sheets on your bed the night of  your first shower and do not  sleep with pets. Day of Surgery : Do not apply any lotions/deodorants the morning of surgery.  Please wear clean clothes to the hospital/surgery center.  FAILURE TO FOLLOW THESE INSTRUCTIONS MAY RESULT IN THE CANCELLATION OF YOUR SURGERY PATIENT SIGNATURE_________________________________  NURSE SIGNATURE__________________________________  ________________________________________________________________________

## 2020-04-20 ENCOUNTER — Encounter (HOSPITAL_COMMUNITY): Payer: Self-pay

## 2020-04-20 ENCOUNTER — Other Ambulatory Visit: Payer: Self-pay

## 2020-04-20 ENCOUNTER — Encounter (HOSPITAL_COMMUNITY)
Admission: RE | Admit: 2020-04-20 | Discharge: 2020-04-20 | Disposition: A | Discharge status: Home / Self Care | Payer: Medicare Other | Source: Ambulatory Visit | Attending: Orthopedic Surgery | Admitting: Orthopedic Surgery

## 2020-04-20 HISTORY — DX: Dyspnea, unspecified: R06.00

## 2020-04-20 HISTORY — DX: Unspecified asthma, uncomplicated: J45.909

## 2020-04-20 NOTE — Progress Notes (Signed)
COVID Vaccine Completed:Yes Date COVID Vaccine completed:09/2019 Booster 03/2020 COVID vaccine manufacturer: Pfizer       PCP - Dr. Ladell Heads PA Cardiologist - Dr. Caryl Pina  Chest x-ray - no EKG - 02/10/20-epic Stress Test - 2019 ECHO - 03/09/20-epic Cardiac Cath - 11/23/18-epic Pacemaker/ICD device last checked:NA  Sleep Study - no CPAP -   Fasting Blood Sugar - NA Checks Blood Sugar _____ times a day  Blood Thinner Instructions:Plavix/ [facility] Aspirin Instructions:none,. Pt told to call MD Last Dose:  Anesthesia review:   Patient denies shortness of breath, fever, cough and chest pain at PAT appointment  yes Patient verbalized understanding of instructions that were given to them at the PAT appointment. Patient was also instructed that they will need to review over the PAT instructions again at home before surgery. Yes Pt is SOB climbing stairs, working around the house and sometimes with ADLs  He uses an inhaler 2-3 times daily

## 2020-04-21 NOTE — Progress Notes (Signed)
Anesthesia Chart Review   Case: [id number] Date/Time: 04/28/20 0715   Procedure: REVERSE SHOULDER ARTHROPLASTY (Right Shoulder)   Anesthesia type: General   Pre-op diagnosis: Right shoulder rotator cuff tear arthropathy   Location: [facility] ROOM 07 / [facility]   Surgeons: Netta Cedars, MD      DISCUSSION:65 y.o. former smoker with h/o GERD, HTN, CAD, asthma, CVA, right shoulder rotator cuff tear scheduled for above procedure 04/28/20 with Dr. Netta Cedars.    Pt last seen by cardiology 02/10/20. Per OV note, "He has done well in the last year CAD is stable no angina on current medical therapy New [place] Heart Association class I he will continue clopidogrel beta-blocker oral nitrate and ranolazine along with lipid-lowering therapy. He does not require an ischemia evaluation."  Echo 03/09/20 with EF 60-65%.  1 year follow up recommended.   Last dose of Plavix 04/23/2020.  VS: Ht _0  (1.676 m)   Wt 86.6 kg   BMI 30.83 kg/m   PROVIDERS: Dustin Flock, PA-C is PCP   Shirlee More, MD is Cardiologist  LABS: Labs reviewed: Acceptable for surgery. (all labs ordered are listed, but only abnormal results are displayed)  Labs Reviewed - No data to display   IMAGES:   EKG:   CV: Echo 03/09/20 PRESSIONS    1. Left ventricular ejection fraction, by estimation, is 60 to 65%. The  left ventricle has normal function. The left ventricle has no regional  wall motion abnormalities. There is mild left ventricular hypertrophy.  Left ventricular diastolic parameters  are consistent with Grade I diastolic dysfunction (impaired relaxation).  2. Right ventricular systolic function is normal. The right ventricular  size is normal. There is normal pulmonary artery systolic pressure.  3. The mitral valve is normal in structure. No evidence of mitral valve  regurgitation. No evidence of mitral stenosis.  4. The aortic valve is normal in structure. Aortic valve regurgitation is  mild. No aortic stenosis  is present.  5. The inferior vena cava is normal in size with greater than 50%  respiratory variability, suggesting right atrial pressure of 3 mmHg.  Cardiac Cath 11/23/2018   Prox RCA lesion is 20% stenosed.  Dist RCA lesion is 20% stenosed.  Prox Cx to Mid Cx lesion is 40% stenosed.  Previously placed Prox LAD to Mid LAD stent (unknown type) is widely patent.   1. Mild non-obstructive disease in the RCA and circumflex 2. Patent mid LAD stents with no restenosis 3. Normal LV filling pressures  Past Medical History:  Diagnosis Date  . Acute blood loss anemia 06/12/2015  . Acute lower GI bleeding 06/12/2015  . Asthma   . Bronchiectasis with acute exacerbation ([place]) 11/17/2012  . Bronchiectasis without acute exacerbation ([place]) 06/21/2010   Ct chest 2011:  bilat lower lobe bronchiectasis with tiny nodular infiltrates scattered in the area (?MAC colonization) Spirometry 2012:  No airflow obstruction Spirometry 2014:  Normal.  [name] 2016:  Normal flows.    . Cervicalgia 10/21/2017  . Cholelithiasis 05/29/2015  . Chronic midline low back pain without sciatica 10/21/2017  . Coronary artery disease involving native coronary artery with angina pectoris ([place]) 11/14/2014   Overview:  1.Taxus Stent to the LAD 06/30/07 with patent previous LAD stents  2. Cath 05/21/2011 without restenosis or obstructive stenosis 3. Cath 03/04/13 with patent stent, EF 55-60%.  . CVA (cerebral vascular accident) ([place]) 2017  . Diverticulitis of rectosigmoid 05/29/2015  . Dyspnea   . Essential hypertension 05/14/2010   Qualifier: Diagnosis of  By: Charma Igo    . GERD (gastroesophageal reflux disease)    s/p esophageal dilation for stricture  . 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 Hyperlipidemia   . Hypertension   . Hypokalemia 06/12/2015  . Hypophosphatemia 06/12/2015  . Impingement syndrome of right shoulder 05/09/2016  . Leukocytosis 06/12/2015  . Mixed hyperlipidemia 02/29/2016   Overview:   Added automatically from request for surgery [id number]  . MYOCARDIAL INFARCTION 05/14/2010   Qualifier: History of  By: Charma Igo    . Myocardial infarction ([place])    +Sees [facility]. +CAD -recent nuclear scan with EF 40%, ?ischemia  . Postoperative ileus ([place]) 06/05/2015  . TIA (transient ischemic attack) 05/14/2010   Qualifier: Diagnosis of  By: Charma Igo      Past Surgical History:  Procedure Laterality Date  . COLON SURGERY  2017   blood loss  . CORONARY STENT PLACEMENT     x 3  . JOINT REPLACEMENT Bilateral    hips  . LEFT HEART CATH AND CORONARY ANGIOGRAPHY N/A 11/23/2018   Procedure: LEFT HEART CATH AND CORONARY ANGIOGRAPHY;  Surgeon: Burnell Blanks, MD;  Location: [place] CV LAB;  Service: Cardiovascular;  Laterality: N/A;  . ROTATOR CUFF REPAIR     right  . TOTAL HIP ARTHROPLASTY     x 2    MEDICATIONS: . albuterol (VENTOLIN HFA) 108 (90 Base) MCG/ACT inhaler  . atorvastatin (LIPITOR) 40 MG tablet  . baclofen (LIORESAL) 10 MG tablet  . carvedilol (COREG) 12.5 MG tablet  . cetirizine (ZYRTEC) 10 MG tablet  . citalopram (CELEXA) 40 MG tablet  . clopidogrel (PLAVIX) 75 MG tablet  . docusate sodium (COLACE) 50 MG capsule  . esomeprazole (NEXIUM) 40 MG capsule  . fluticasone (FLONASE) 50 MCG/ACT nasal spray  . isosorbide mononitrate (IMDUR) 60 MG 24 hr tablet  . lactulose (CHRONULAC) 10 GM/15ML solution  . lidocaine (LIDODERM) 5 %  . linaclotide (LINZESS) 145 MCG CAPS capsule  . Multiple Vitamin (MULTIVITAMIN WITH MINERALS) TABS tablet  . Multiple Vitamin (MULTIVITAMIN WITH MINERALS) TABS tablet  . nitroGLYCERIN (NITROSTAT) 0.4 MG SL tablet  . olopatadine (PATANOL) 0.1 % ophthalmic solution  . omeprazole (PRILOSEC) 40 MG capsule  . Oxycodone HCl 10 MG TABS  . ranolazine (RANEXA) 500 MG 12 hr tablet  . tamsulosin (FLOMAX) 0.4 MG CAPS capsule  . testosterone cypionate (DEPOTESTOSTERONE CYPIONATE) 200 MG/ML injection   No current facility-administered  medications for this encounter.      Konrad Felix, PA-C [facility] Pre-Surgical Testing [phone]

## 2020-04-25 ENCOUNTER — Encounter (HOSPITAL_COMMUNITY)
Admission: RE | Admit: 2020-04-25 | Discharge: 2020-04-25 | Disposition: A | Discharge status: Home / Self Care | Payer: Medicare Other | Source: Ambulatory Visit | Attending: Orthopedic Surgery | Admitting: Orthopedic Surgery

## 2020-04-25 ENCOUNTER — Other Ambulatory Visit: Payer: Self-pay

## 2020-04-25 ENCOUNTER — Other Ambulatory Visit (HOSPITAL_COMMUNITY)
Admission: RE | Admit: 2020-04-25 | Discharge: 2020-04-25 | Disposition: A | Discharge status: Home / Self Care | Payer: Medicare Other | Source: Ambulatory Visit | Attending: Orthopedic Surgery | Admitting: Orthopedic Surgery

## 2020-04-25 DIAGNOSIS — Z01812 Encounter for preprocedural laboratory examination: Secondary | ICD-10-CM | POA: Diagnosis present

## 2020-04-25 DIAGNOSIS — Z20822 Contact with and (suspected) exposure to covid-19: Secondary | ICD-10-CM | POA: Diagnosis not present

## 2020-04-25 LAB — CBC
HCT: 46.9 % (ref 39.0–52.0)
Hemoglobin: 15.6 g/dL (ref 13.0–17.0)
MCH: 33.9 pg (ref 26.0–34.0)
MCHC: 33.3 g/dL (ref 30.0–36.0)
MCV: 102 fL — ABNORMAL HIGH (ref 80.0–100.0)
Platelets: 124 10*3/uL — ABNORMAL LOW (ref 150–400)
RBC: 4.6 MIL/uL (ref 4.22–5.81)
RDW: 13.6 % (ref 11.5–15.5)
WBC: 7.2 10*3/uL (ref 4.0–10.5)
nRBC: 0 % (ref 0.0–0.2)

## 2020-04-25 LAB — SURGICAL PCR SCREEN
MRSA, PCR: NEGATIVE
Staphylococcus aureus: NEGATIVE

## 2020-04-25 LAB — SARS CORONAVIRUS 2 (TAT 6-24 HRS): SARS Coronavirus 2: NEGATIVE

## 2020-04-25 NOTE — Progress Notes (Signed)
Pt was told by Dr. Dulce Sellar to stop Plavix 4 days prior to DOS. Pt reports that he stopped it 04/23/20.

## 2020-04-27 NOTE — Anesthesia Preprocedure Evaluation (Addendum)
Anesthesia Evaluation  Patient identified by MRN, date of birth, ID band Patient awake    Reviewed: Allergy & Precautions, NPO status , Patient's Chart, lab work & pertinent test results  Airway Mallampati: II  TM Distance: >3 FB Neck ROM: Full    Dental no notable dental hx. (+) Teeth Intact, Dental Advisory Given   Pulmonary asthma ,  COPD inhaler, former smoker,  Pt w Bronchiectasis   Pulmonary exam normal breath sounds clear to auscultation       Cardiovascular Exercise Tolerance: Good hypertension, Pt. on medications + CAD, + Past MI (2012) and + Cardiac Stents  Normal cardiovascular exam Rhythm:Regular Rate:Normal  03/09/20 Echo  1. Left ventricular ejection fraction, by estimation, is 60 to 65%. The  left ventricle has normal function. The left ventricle has no regional  wall motion abnormalities. There is mild left ventricular hypertrophy.  Left ventricular diastolic parameters  are consistent with Grade I diastolic dysfunction (impaired relaxation).  2. Right ventricular systolic function is normal. The right ventricular  size is normal. There is normal pulmonary artery systolic pressure.    Neuro/Psych TIAnegative psych ROS   GI/Hepatic Neg liver ROS, GERD  ,  Endo/Other    Renal/GU      Musculoskeletal  (+) Arthritis ,   Abdominal (+) + obese,   Peds  Hematology Hgb 15.6 Plt 124   Anesthesia Other Findings All Latex  Reproductive/Obstetrics                            Anesthesia Physical Anesthesia Plan  ASA: III  Anesthesia Plan: General   Post-op Pain Management:  Regional for Post-op pain   Induction: Intravenous  PONV Risk Score and Plan: 3 and Treatment may vary due to age or medical condition, Midazolam, Dexamethasone and Ondansetron  Airway Management Planned: Oral ETT  Additional Equipment: None  Intra-op Plan:   Post-operative Plan: Extubation in  OR  Informed Consent: I have reviewed the patients History and Physical, chart, labs and discussed the procedure including the risks, benefits and alternatives for the proposed anesthesia with the patient or authorized representative who has indicated his/her understanding and acceptance.     Dental advisory given  Plan Discussed with: CRNA and Anesthesiologist  Anesthesia Plan Comments: (GA w R ISB w Exparel)       Anesthesia Quick Evaluation

## 2020-04-28 ENCOUNTER — Encounter (HOSPITAL_COMMUNITY)
Admission: RE | Disposition: A | Discharge status: Home / Self Care | Payer: Self-pay | Source: Home / Self Care | Attending: Orthopedic Surgery

## 2020-04-28 ENCOUNTER — Ambulatory Visit (HOSPITAL_COMMUNITY)
Admission: RE | Admit: 2020-04-28 | Discharge: 2020-04-28 | Disposition: A | Discharge status: Home / Self Care | Payer: Medicare Other | Attending: Orthopedic Surgery | Admitting: Orthopedic Surgery

## 2020-04-28 ENCOUNTER — Ambulatory Visit (HOSPITAL_COMMUNITY): Payer: Medicare Other | Admitting: Physician Assistant

## 2020-04-28 ENCOUNTER — Other Ambulatory Visit (HOSPITAL_COMMUNITY): Payer: Self-pay | Admitting: Orthopedic Surgery

## 2020-04-28 ENCOUNTER — Ambulatory Visit (HOSPITAL_COMMUNITY): Payer: Medicare Other | Admitting: Anesthesiology

## 2020-04-28 ENCOUNTER — Encounter (HOSPITAL_COMMUNITY): Payer: Self-pay | Admitting: Orthopedic Surgery

## 2020-04-28 DIAGNOSIS — Z91040 Latex allergy status: Secondary | ICD-10-CM | POA: Insufficient documentation

## 2020-04-28 DIAGNOSIS — Z87891 Personal history of nicotine dependence: Secondary | ICD-10-CM | POA: Insufficient documentation

## 2020-04-28 DIAGNOSIS — Z955 Presence of coronary angioplasty implant and graft: Secondary | ICD-10-CM | POA: Insufficient documentation

## 2020-04-28 DIAGNOSIS — Z79899 Other long term (current) drug therapy: Secondary | ICD-10-CM | POA: Insufficient documentation

## 2020-04-28 DIAGNOSIS — Z8673 Personal history of transient ischemic attack (TIA), and cerebral infarction without residual deficits: Secondary | ICD-10-CM | POA: Insufficient documentation

## 2020-04-28 DIAGNOSIS — I252 Old myocardial infarction: Secondary | ICD-10-CM | POA: Diagnosis not present

## 2020-04-28 DIAGNOSIS — Z7982 Long term (current) use of aspirin: Secondary | ICD-10-CM | POA: Insufficient documentation

## 2020-04-28 DIAGNOSIS — M75101 Unspecified rotator cuff tear or rupture of right shoulder, not specified as traumatic: Secondary | ICD-10-CM | POA: Insufficient documentation

## 2020-04-28 HISTORY — PX: REVERSE SHOULDER ARTHROPLASTY: SHX5054

## 2020-04-28 SURGERY — ARTHROPLASTY, SHOULDER, TOTAL, REVERSE
Anesthesia: General | Site: Shoulder | Laterality: Right

## 2020-04-28 MED ORDER — BUPIVACAINE-EPINEPHRINE (PF) 0.25% -1:200000 IJ SOLN
INTRAMUSCULAR | Status: AC
  Filled 2020-04-28: qty 30

## 2020-04-28 MED ORDER — ACETAMINOPHEN 10 MG/ML IV SOLN
1000.0000 mg | Freq: Once | INTRAVENOUS | Status: DC | PRN
Start: 2020-04-28 — End: 2020-04-28

## 2020-04-28 MED ORDER — DEXAMETHASONE SODIUM PHOSPHATE 10 MG/ML IJ SOLN
INTRAMUSCULAR | Status: DC | PRN
  Administered 2020-04-28: 5 mg via INTRAVENOUS

## 2020-04-28 MED ORDER — FENTANYL CITRATE (PF) 250 MCG/5ML IJ SOLN
INTRAMUSCULAR | Status: AC
  Filled 2020-04-28: qty 5

## 2020-04-28 MED ORDER — OXYCODONE HCL 5 MG/5ML PO SOLN: 5.0000 mg | Freq: Once | ORAL | Status: DC | PRN

## 2020-04-28 MED ORDER — ROCURONIUM BROMIDE 10 MG/ML (PF) SYRINGE
PREFILLED_SYRINGE | INTRAVENOUS | Status: AC
  Filled 2020-04-28: qty 10

## 2020-04-28 MED ORDER — SUCCINYLCHOLINE CHLORIDE 200 MG/10ML IV SOSY
PREFILLED_SYRINGE | INTRAVENOUS | Status: AC
  Filled 2020-04-28: qty 10

## 2020-04-28 MED ORDER — LIDOCAINE HCL (PF) 2 % IJ SOLN
INTRAMUSCULAR | Status: AC
  Filled 2020-04-28: qty 5

## 2020-04-28 MED ORDER — PROPOFOL 10 MG/ML IV BOLUS
INTRAVENOUS | Status: DC | PRN
  Administered 2020-04-28: 120 mg via INTRAVENOUS

## 2020-04-28 MED ORDER — BUPIVACAINE LIPOSOME 1.3 % IJ SUSP
INTRAMUSCULAR | Status: DC | PRN
Start: 2020-04-28 — End: 2020-04-28
  Administered 2020-04-28: 10 mL via PERINEURAL

## 2020-04-28 MED ORDER — PROPOFOL 10 MG/ML IV BOLUS
INTRAVENOUS | Status: AC
  Filled 2020-04-28: qty 20

## 2020-04-28 MED ORDER — ONDANSETRON HCL 4 MG/2ML IJ SOLN: 4.0000 mg | Freq: Once | INTRAMUSCULAR | Status: DC | PRN

## 2020-04-28 MED ORDER — BUPIVACAINE HCL (PF) 0.5 % IJ SOLN
INTRAMUSCULAR | Status: DC | PRN
  Administered 2020-04-28: 15 mL via PERINEURAL

## 2020-04-28 MED ORDER — SUCCINYLCHOLINE CHLORIDE 200 MG/10ML IV SOSY
PREFILLED_SYRINGE | INTRAVENOUS | Status: DC | PRN
  Administered 2020-04-28: 80 mg via INTRAVENOUS

## 2020-04-28 MED ORDER — MEPERIDINE HCL 50 MG/ML IJ SOLN: 6.2500 mg | INTRAMUSCULAR | Status: DC | PRN

## 2020-04-28 MED ORDER — LIDOCAINE 2% (20 MG/ML) 5 ML SYRINGE
INTRAMUSCULAR | Status: DC | PRN
  Administered 2020-04-28: 100 mg via INTRAVENOUS

## 2020-04-28 MED ORDER — OXYCODONE HCL 10 MG PO TABS: 10.0000 mg | ORAL_TABLET | Freq: Four times a day (QID) | ORAL | 0 refills | Status: DC | PRN

## 2020-04-28 MED ORDER — EPHEDRINE SULFATE-NACL 50-0.9 MG/10ML-% IV SOSY
PREFILLED_SYRINGE | INTRAVENOUS | Status: DC | PRN
  Administered 2020-04-28 (×4): 10 mg via INTRAVENOUS

## 2020-04-28 MED ORDER — OXYCODONE HCL 5 MG PO TABS: 5.0000 mg | ORAL_TABLET | Freq: Once | ORAL | Status: DC | PRN

## 2020-04-28 MED ORDER — CHLORHEXIDINE GLUCONATE 0.12 % MT SOLN
15.0000 mL | Freq: Once | OROMUCOSAL | Status: AC
  Administered 2020-04-28: 15 mL via OROMUCOSAL

## 2020-04-28 MED ORDER — METHOCARBAMOL 500 MG PO TABS: 500.0000 mg | ORAL_TABLET | Freq: Four times a day (QID) | ORAL | 1 refills | Status: DC | PRN

## 2020-04-28 MED ORDER — PHENYLEPHRINE 40 MCG/ML (10ML) SYRINGE FOR IV PUSH (FOR BLOOD PRESSURE SUPPORT)
PREFILLED_SYRINGE | INTRAVENOUS | Status: DC | PRN
  Administered 2020-04-28: 80 ug via INTRAVENOUS
  Administered 2020-04-28: 120 ug via INTRAVENOUS
  Administered 2020-04-28: 80 ug via INTRAVENOUS

## 2020-04-28 MED ORDER — LACTATED RINGERS IV SOLN: INTRAVENOUS | Status: DC

## 2020-04-28 MED ORDER — STERILE WATER FOR IRRIGATION IR SOLN
Status: DC | PRN
  Administered 2020-04-28: 1000 mL

## 2020-04-28 MED ORDER — EPHEDRINE 5 MG/ML INJ
INTRAVENOUS | Status: AC
  Filled 2020-04-28: qty 10

## 2020-04-28 MED ORDER — ONDANSETRON HCL 4 MG/2ML IJ SOLN
INTRAMUSCULAR | Status: DC | PRN
  Administered 2020-04-28: 4 mg via INTRAVENOUS

## 2020-04-28 MED ORDER — AMISULPRIDE (ANTIEMETIC) 5 MG/2ML IV SOLN: 10.0000 mg | Freq: Once | INTRAVENOUS | Status: DC | PRN

## 2020-04-28 MED ORDER — MIDAZOLAM HCL 5 MG/5ML IJ SOLN
INTRAMUSCULAR | Status: DC | PRN
  Administered 2020-04-28 (×2): 1 mg via INTRAVENOUS

## 2020-04-28 MED ORDER — ONDANSETRON HCL 4 MG PO TABS: 4.0000 mg | ORAL_TABLET | Freq: Three times a day (TID) | ORAL | 1 refills | Status: DC | PRN

## 2020-04-28 MED ORDER — PHENYLEPHRINE HCL-NACL 20-0.9 MG/250ML-% IV SOLN
INTRAVENOUS | Status: DC | PRN
  Administered 2020-04-28: 100 ug/min via INTRAVENOUS

## 2020-04-28 MED ORDER — ONDANSETRON HCL 4 MG/2ML IJ SOLN
INTRAMUSCULAR | Status: AC
  Filled 2020-04-28: qty 2

## 2020-04-28 MED ORDER — PHENYLEPHRINE 40 MCG/ML (10ML) SYRINGE FOR IV PUSH (FOR BLOOD PRESSURE SUPPORT)
PREFILLED_SYRINGE | INTRAVENOUS | Status: AC
  Filled 2020-04-28: qty 10

## 2020-04-28 MED ORDER — SODIUM CHLORIDE 0.9 % IR SOLN
Status: DC | PRN
  Administered 2020-04-28: 1000 mL

## 2020-04-28 MED ORDER — PHENYLEPHRINE HCL (PRESSORS) 10 MG/ML IV SOLN
INTRAVENOUS | Status: AC
  Filled 2020-04-28: qty 2

## 2020-04-28 MED ORDER — FENTANYL CITRATE (PF) 250 MCG/5ML IJ SOLN
INTRAMUSCULAR | Status: DC | PRN
  Administered 2020-04-28 (×2): 50 ug via INTRAVENOUS

## 2020-04-28 MED ORDER — HYDROMORPHONE HCL 1 MG/ML IJ SOLN: 0.2500 mg | INTRAMUSCULAR | Status: DC | PRN

## 2020-04-28 MED ORDER — CEFAZOLIN SODIUM-DEXTROSE 2-4 GM/100ML-% IV SOLN
2.0000 g | INTRAVENOUS | Status: AC
  Administered 2020-04-28: 2 g via INTRAVENOUS
  Filled 2020-04-28: qty 100

## 2020-04-28 MED ORDER — BUPIVACAINE-EPINEPHRINE (PF) 0.25% -1:200000 IJ SOLN
INTRAMUSCULAR | Status: DC | PRN
  Administered 2020-04-28: 16 mL via PERINEURAL

## 2020-04-28 MED ORDER — DEXAMETHASONE SODIUM PHOSPHATE 10 MG/ML IJ SOLN
INTRAMUSCULAR | Status: AC
  Filled 2020-04-28: qty 1

## 2020-04-28 MED ORDER — MIDAZOLAM HCL 2 MG/2ML IJ SOLN
INTRAMUSCULAR | Status: AC
  Filled 2020-04-28: qty 2

## 2020-04-28 MED ORDER — ORAL CARE MOUTH RINSE: 15.0000 mL | Freq: Once | OROMUCOSAL | Status: AC

## 2020-04-28 MED FILL — METHOCARBAMOL 500 MG TABS: 500 | 10 days supply | Qty: 40 | Fill #0

## 2020-04-28 MED FILL — ONDANSETRON HCL 4 MG TABS: 4 | 10 days supply | Qty: 30 | Fill #0

## 2020-04-28 SURGICAL SUPPLY — 72 items
AID PSTN UNV HD RSTRNT DISP (MISCELLANEOUS) ×1
BAG SPEC THK2 15X12 ZIP CLS (MISCELLANEOUS)
BAG ZIPLOCK 12X15 (MISCELLANEOUS) IMPLANT
BIT DRILL 1.6MX128 (BIT) IMPLANT
BIT DRILL 170X2.5X (BIT) IMPLANT
BIT DRL 170X2.5X (BIT) ×1
BLADE SAG 18X100X1.27 (BLADE) ×2 IMPLANT
COVER BACK TABLE 60X90IN (DRAPES) ×2 IMPLANT
COVER SURGICAL LIGHT HANDLE (MISCELLANEOUS) ×2 IMPLANT
COVER WAND RF STERILE (DRAPES) IMPLANT
DECANTER SPIKE VIAL GLASS SM (MISCELLANEOUS) ×2 IMPLANT
DRAPE INCISE IOBAN 66X45 STRL (DRAPES) ×2 IMPLANT
DRAPE ORTHO SPLIT 77X108 STRL (DRAPES) ×4
DRAPE SHEET LG 3/4 BI-LAMINATE (DRAPES) ×2 IMPLANT
DRAPE SURG ORHT 6 SPLT 77X108 (DRAPES) ×2 IMPLANT
DRAPE TOP 10253 STERILE (DRAPES) ×2 IMPLANT
DRAPE U-SHAPE 47X51 STRL (DRAPES) ×2 IMPLANT
DRILL 2.5 (BIT) ×2
DRSG ADAPTIC 3X8 NADH LF (GAUZE/BANDAGES/DRESSINGS) ×2 IMPLANT
DRSG PAD ABDOMINAL 8X10 ST (GAUZE/BANDAGES/DRESSINGS) ×2 IMPLANT
DURAPREP 26ML APPLICATOR (WOUND CARE) ×2 IMPLANT
ECCENTRIC EPIPHYSI MODULAR SZ1 (Trauma) IMPLANT
ELECT BLADE TIP CTD 4 INCH (ELECTRODE) ×2 IMPLANT
ELECT NDL TIP 2.8 STRL (NEEDLE) ×1 IMPLANT
ELECT NEEDLE TIP 2.8 STRL (NEEDLE) ×2 IMPLANT
ELECT REM PT RETURN 15FT ADLT (MISCELLANEOUS) ×2 IMPLANT
FACESHIELD WRAPAROUND (MASK) ×2 IMPLANT
FACESHIELD WRAPAROUND OR TEAM (MASK) ×1 IMPLANT
GAUZE SPONGE 4X4 12PLY STRL (GAUZE/BANDAGES/DRESSINGS) ×2 IMPLANT
GLENOSPHERE DELTA XTEND LAT (Shoulder) ×1 IMPLANT
GLOVE BIOGEL PI ORTHO PRO 7.5 (GLOVE) ×1
GLOVE BIOGEL PI ORTHO PRO SZ8 (GLOVE) ×1
GLOVE ORTHO TXT STRL SZ7.5 (GLOVE) ×2 IMPLANT
GLOVE PI ORTHO PRO STRL 7.5 (GLOVE) ×1 IMPLANT
GLOVE PI ORTHO PRO STRL SZ8 (GLOVE) ×1 IMPLANT
GLOVE SURG ORTHO LTX SZ8.5 (GLOVE) ×2 IMPLANT
GOWN STRL REUS W/TWL XL LVL3 (GOWN DISPOSABLE) ×4 IMPLANT
KIT BASIN OR (CUSTOM PROCEDURE TRAY) ×2 IMPLANT
KIT TURNOVER KIT A (KITS) IMPLANT
MANIFOLD NEPTUNE II (INSTRUMENTS) ×2 IMPLANT
METAGLENE DELTA EXTEND (Trauma) IMPLANT
METAGLENE DXTEND (Trauma) ×2 IMPLANT
MODULAR ECCENTRIC EPIPHYSI SZ1 (Trauma) ×2 IMPLANT
NDL MAYO CATGUT SZ4 TPR NDL (NEEDLE) IMPLANT
NEEDLE MAYO CATGUT SZ4 (NEEDLE) IMPLANT
NS IRRIG 1000ML POUR BTL (IV SOLUTION) ×2 IMPLANT
PACK SHOULDER (CUSTOM PROCEDURE TRAY) ×2 IMPLANT
PENCIL SMOKE EVACUATOR (MISCELLANEOUS) IMPLANT
PIN GUIDE 1.2 (PIN) ×1 IMPLANT
PIN GUIDE GLENOPHERE 1.5MX300M (PIN) ×1 IMPLANT
PIN METAGLENE 2.5 (PIN) ×1 IMPLANT
PROTECTOR NERVE ULNAR (MISCELLANEOUS) ×2 IMPLANT
RESTRAINT HEAD UNIVERSAL NS (MISCELLANEOUS) ×2 IMPLANT
SCREW 4.5X36MM (Screw) ×1 IMPLANT
SCREW LOCK DELTA XTEND 4.5X30 (Screw) ×1 IMPLANT
SLING ARM FOAM STRAP LRG (SOFTGOODS) ×1 IMPLANT
SMARTMIX MINI TOWER (MISCELLANEOUS)
SPACER 38 PLUS 3 (Spacer) ×1 IMPLANT
SPONGE LAP 4X18 RFD (DISPOSABLE) IMPLANT
STEM 12 HA (Stem) ×1 IMPLANT
STRIP CLOSURE SKIN 1/2X4 (GAUZE/BANDAGES/DRESSINGS) ×2 IMPLANT
SUCTION FRAZIER HANDLE 10FR (MISCELLANEOUS) ×2
SUCTION TUBE FRAZIER 10FR DISP (MISCELLANEOUS) ×1 IMPLANT
SUT FIBERWIRE #2 38 T-5 BLUE (SUTURE) ×4
SUT MNCRL AB 4-0 PS2 18 (SUTURE) ×2 IMPLANT
SUT VIC AB 0 CT1 36 (SUTURE) ×4 IMPLANT
SUT VIC AB 0 CT2 27 (SUTURE) ×2 IMPLANT
SUT VIC AB 2-0 CT1 27 (SUTURE) ×2
SUT VIC AB 2-0 CT1 TAPERPNT 27 (SUTURE) ×1 IMPLANT
SUTURE FIBERWR #2 38 T-5 BLUE (SUTURE) ×2 IMPLANT
TOWEL OR 17X26 10 PK STRL BLUE (TOWEL DISPOSABLE) ×2 IMPLANT
TOWER SMARTMIX MINI (MISCELLANEOUS) IMPLANT

## 2020-04-28 NOTE — Anesthesia Procedure Notes (Signed)
Anesthesia Regional Block: Interscalene brachial plexus block   Pre-Anesthetic Checklist: ,, timeout performed, Correct Patient, Correct Site, Correct Laterality, Correct Procedure, Correct Position, site marked, Risks and benefits discussed,  Surgical consent,  Pre-op evaluation,  At surgeon's request and post-op pain management  Laterality: Upper and Right  Prep: Maximum Sterile Barrier Precautions used, chloraprep       Needles:  Injection technique: Single-shot  Needle Type: Echogenic Needle     Needle Length: 5cm  Needle Gauge: 21     Additional Needles:   Procedures:,,,, ultrasound used (permanent image in chart),,,,  Narrative:  Start time: 04/28/2020 6:55 AM End time: 04/28/2020 7:02 AM Injection made incrementally with aspirations every 5 mL.  Performed by: Personally  Anesthesiologist: [name], MD  Additional Notes: Block assessed prior to procedure. Patient tolerated procedure well.

## 2020-04-28 NOTE — Brief Op Note (Signed)
04/28/2020  9:00 AM  PATIENT:  Gary Diaz  65 y.o. male  PRE-OPERATIVE DIAGNOSIS:  Right shoulder rotator cuff tear arthropathy  POST-OPERATIVE DIAGNOSIS:  Right shoulder rotator cuff tear arthropathy  PROCEDURE:  Procedure(s): REVERSE SHOULDER ARTHROPLASTY (Right)  DePuy Delta Xtend, NO subscap repair SURGEON:  Surgeon(s) and Role:    Beverely Low, MD - Primary  PHYSICIAN ASSISTANT:   ASSISTANTS: Thea Gist, PA-C   ANESTHESIA:   regional and general  EBL:  75 mL   BLOOD ADMINISTERED:none  DRAINS: none   LOCAL MEDICATIONS USED:  MARCAINE     SPECIMEN:  No Specimen  DISPOSITION OF SPECIMEN:  N/A  COUNTS:  YES  TOURNIQUET:  * No tourniquets in log *  DICTATION: .Other Dictation: Dictation Number [id number]  PLAN OF CARE: Discharge to home after PACU  PATIENT DISPOSITION:  PACU - hemodynamically stable.   Delay start of Pharmacological VTE agent (>24hrs) due to surgical blood loss or risk of bleeding: not applicable

## 2020-04-28 NOTE — Discharge Instructions (Signed)
Ice to the shoulder constantly.  Keep the incision covered and clean and dry for one week, then ok to get it wet in the shower.  Do exercise as instructed several times per day.  DO NOT reach behind your back or push up out of a chair with the operative arm.  Use a sling while you are up and around for comfort, may remove while seated.  Keep pillow propped behind the operative elbow in order to keep your arm across the front of your abdomen/waist  Follow up with Dr Ranell Patrick in two weeks in the office, call [phone] for appt

## 2020-04-28 NOTE — Interval H&P Note (Signed)
History and Physical Interval Note:  04/28/2020 7:12 AM  Gary Diaz  has presented today for surgery, with the diagnosis of Right shoulder rotator cuff tear arthropathy.  The various methods of treatment have been discussed with the patient and family. After consideration of risks, benefits and other options for treatment, the patient has consented to  Procedure(s): REVERSE SHOULDER ARTHROPLASTY (Right) as a surgical intervention.  The patient's history has been reviewed, patient examined, no change in status, stable for surgery.  I have reviewed the patient's chart and labs.  Questions were answered to the patient's satisfaction.     Verlee Rossetti

## 2020-04-28 NOTE — Op Note (Signed)
NAME: JAROME, TRULL MEDICAL RECORD ZO:[id number] ACCOUNT [id number] DATE OF BIRTH:11/06/1955 FACILITY: [facility] LOCATION: [facility] PHYSICIAN:STEVEN Russ Halo, MD  OPERATIVE REPORT  DATE OF PROCEDURE:  04/28/2020  PREOPERATIVE DIAGNOSIS:  Right shoulder rotator cuff tear arthropathy.  POSTOPERATIVE DIAGNOSIS:  Right shoulder rotator cuff tear arthropathy.  PROCEDURE PERFORMED:  Right reverse total shoulder replacement using DePuy Delta Xtend prosthesis with no subscap repair.  ATTENDING SURGEON:  Malon Kindle, MD  ASSISTANT:  Modesto Charon, [place], who was scrubbed during the entire procedure and necessary for satisfactory completion of surgery.  ANESTHESIA:  General anesthesia was used plus interscalene block.  ESTIMATED BLOOD LOSS:  150 mL.  FLUID REPLACEMENT:  1500 mL crystalloid.  INSTRUMENT COUNTS:  Correct.  COMPLICATIONS:  No complications.  ANTIBIOTICS:  Perioperative antibiotics were given.  INDICATIONS:  The patient is a 65 year old male with a history of worsening right shoulder pain and dysfunction secondary to rotator cuff tear arthropathy.  The patient has failed conservative management.  Desires operative treatment to restore function  and eliminate pain in the shoulder.  Informed consent obtained.  DESCRIPTION OF PROCEDURE:  After an adequate level of anesthesia was achieved, the patient was positioned in modified beach chair position.  Right shoulder correctly identified and sterilely prepped and draped in the usual manner.  Time-out called,  verifying correct patient, correct site.  We entered the shoulder using a standard deltopectoral approach starting at the coracoid process extending down to the anterior humerus.  Dissection down through subcutaneous tissues using Bovie.  We identified  the cephalic vein and took that laterally with the deltoid pectoralis taken medially.  Conjoined tendon identified and retracted medially.  Deep retractors placed.  I  was not sure whether the patient had a prior biceps tenodesis, but we went ahead and  placed a 0 Vicryl figure-of-eight suture into the biceps at the pectoralis tendon insertion on the humerus, just in case that was still intact.  We want to tenodesed the biceps tendon.  After doing that, we released the subscap remnant, which was not  amenable to repair.  This was just a thin pseudocapsule type remnant with a little bit of muscular tissue inferiorly, but this was released and tagged to protect the axillary nerve.  We released the inferior capsule along the neck, progressively  externally rotating and extending the shoulder, delivering the humeral head out of the wound.  Eburnated bone was noted.  We entered the proximal humerus with a 6 mm reamer, reaming up to a size 12.  We placed a 12 mm intramedullary head resection guide  T-handled and then resected the head at 10 degrees of retroversion with the oscillating saw.  We removed excess osteophytes with a rongeur.  We then subluxed the humerus posteriorly, gaining good exposure of the glenoid face.  We removed the capsule and  the labrum finding a very eroded glenoid.  We found our center point for our guide pin and reamed for the metaglene baseplate, drilling out the central peg hole and impacting the metaglene baseplate into position.  This was a very small glenoid.  Thus,  we could only get superior and inferior screws.  We were able to get a good 36 screw superiorly, a 30 screw inferiorly with good purchase.  We felt like the baseplate security was excellent.  We then took a 38+2 eccentric glenosphere and placed that onto  the baseplate and secured that appropriately.  We had good coverage to that native glenoid with the glenosphere and it  was centered low.  At this point, we went ahead and finished our preparation on the humeral side, reaming for the 1 eccentric  metaphysis, so we had a 12 stem, 1 eccentric set on the 0 setting and placed in 10 degrees  of retroversion.  We impacted that trial in place and reduced the shoulder with a 38+3 trial and had nice stability with the shoulder and no impingement and  appropriate tension on the conjoined tendon.  We removed all the trial components.  We irrigated thoroughly and then used available bone graft from the head and press-fit technique with the HA coated 12 stem and the 1 right metaphysis set on the 0  setting.  Again with that impaction grafting technique with available bone graft, we impacted the stem into place, getting good security with that and then we selected the real 38+3 poly and placed on the humeral tray and then reduced the shoulder.  We  were pleased with our soft tissue balancing and stability.  We removed the traction sutures on the remnant of the subscap.  We did not have to resect any of that.  We irrigated again and then closed the deltopectoral interval with 0 Vicryl suture  followed by 2-0 Vicryl for subcutaneous closure and 4-0 Monocryl for skin.  Steri-Strips applied followed by sterile dressing and a shoulder sling.  The patient transported to recovery room in stable condition.  HN/NUANCE  D:04/28/2020 T:04/28/2020 JOB:[id number]

## 2020-04-28 NOTE — Anesthesia Postprocedure Evaluation (Signed)
Anesthesia Post Note  Patient: [name]  Procedure(s) Performed: REVERSE SHOULDER ARTHROPLASTY (Right Shoulder)     Patient location during evaluation: PACU Anesthesia Type: General Level of consciousness: awake and alert Pain management: pain level controlled Vital Signs Assessment: post-procedure vital signs reviewed and stable Respiratory status: spontaneous breathing, nonlabored ventilation, respiratory function stable and patient connected to nasal cannula oxygen Cardiovascular status: blood pressure returned to baseline and stable Postop Assessment: no apparent nausea or vomiting Anesthetic complications: no   No complications documented.  Last Vitals:  Vitals:   04/28/20 1000 04/28/20 1008  BP: 126/67 128/74  Pulse: 80 89  Resp: 12 13  Temp: 36.7 C 36.7 C  SpO2: 92% 91%    Last Pain:  Vitals:   04/28/20 1000  TempSrc:   PainSc: 0-No pain                 [name]

## 2020-04-28 NOTE — Anesthesia Procedure Notes (Signed)
Procedure Name: Intubation Date/Time: 04/28/2020 7:24 AM Performed by: Elyn Peers, CRNA Pre-anesthesia Checklist: Patient identified, Emergency Drugs available, Suction available, Patient being monitored and Timeout performed Patient Re-evaluated:Patient Re-evaluated prior to induction Oxygen Delivery Method: Circle system utilized Preoxygenation: Pre-oxygenation with 100% oxygen Induction Type: IV induction Laryngoscope Size: Miller and 3 Grade View: Grade I Tube type: Oral Tube size: 7.5 mm Number of attempts: 1 Airway Equipment and Method: Stylet Placement Confirmation: ETT inserted through vocal cords under direct vision,  positive ETCO2 and breath sounds checked- equal and bilateral Secured at: 22 cm Tube secured with: Tape Dental Injury: Teeth and Oropharynx as per pre-operative assessment

## 2020-04-28 NOTE — Transfer of Care (Signed)
Immediate Anesthesia Transfer of Care Note  Patient: Gary Diaz  Procedure(s) Performed: REVERSE SHOULDER ARTHROPLASTY (Right Shoulder)  Patient Location: PACU  Anesthesia Type:GA combined with regional for post-op pain  Level of Consciousness: awake, alert , oriented and patient cooperative  Airway & Oxygen Therapy: Patient Spontanous Breathing and Patient connected to face mask oxygen  Post-op Assessment: Report given to RN and Post -op Vital signs reviewed and stable  Post vital signs: Reviewed and stable  Last Vitals:  Vitals Value Taken Time  BP 137/79 04/28/20 0903  Temp    Pulse 79 04/28/20 0904  Resp 13 04/28/20 0904  SpO2 97 % 04/28/20 0904  Vitals shown include unvalidated device data.  Last Pain:  Vitals:   04/28/20 0600  TempSrc: Oral  PainSc: 5       Patients Stated Pain Goal: 4 (04/28/20 0600)  Complications: No complications documented.

## 2020-05-01 ENCOUNTER — Encounter (HOSPITAL_COMMUNITY): Payer: Self-pay | Admitting: Orthopedic Surgery

## 2021-04-10 DIAGNOSIS — R0600 Dyspnea, unspecified: Secondary | ICD-10-CM | POA: Insufficient documentation

## 2021-04-10 DIAGNOSIS — J45909 Unspecified asthma, uncomplicated: Secondary | ICD-10-CM | POA: Insufficient documentation

## 2021-04-15 NOTE — Progress Notes (Signed)
Cardiology Office Note:    Date:  04/16/2021   ID:  Gary Diaz, DOB [birth year], MRN [id number]  PCP:  Karlton Lemon, PA-C  Cardiologist:  Shirlee More, MD    Referring MD: Dustin Flock, PA-C    ASSESSMENT:    1. Coronary artery disease involving native coronary artery of native heart with angina pectoris ([place])   2. Essential hypertension   3. Mixed hyperlipidemia    PLAN:    In order of problems listed above:  Stable CAD having no angina on current medical therapy continue current treatment including single antiplatelet agent clopidogrel beta-blocker statin oral nitrate and ranolazine.  At this time I do not think he requires an ischemic evaluation LV dysfunction improved normalized EF BP at target continue current treatment Continue his high intensity statin labs requested from his PCP   Next appointment: We will renew his nitroglycerin I will plan to see him in the office 1 year   Medication Adjustments/Labs and Tests Ordered: Current medicines are reviewed at length with the patient today.  Concerns regarding medicines are outlined above.  No orders of the defined types were placed in this encounter.  No orders of the defined types were placed in this encounter.   Chief complaint: Follow-up for CAD   History of Present Illness:    Gary Diaz is a 66 y.o. male with a hx of CAD hypertension hyper lipidemia and left ventricular dysfunction with ejection fraction range of 40 to 45%.  He was last seen 02/10/2020.  Heunderwent left heart catheterization 11/23/2018 showing patent stent LAD and showing mild nonobstructive CAD in the right coronary artery and the left circumflex coronary artery.  Compliance with diet, lifestyle and medications: Yes  He has a new PCP He had recent abs done at opening voice avoid lipids at target requested a copy. At times he is breathless takes nitroglycerin improved but has had no angina  No edema orthopnea  palpitation or syncope  on 02/10/2020 he had an echocardiogram performed 03/09/2020 that showed normalization ejection fraction 60 to 65% mild LVH grade 1 diastolic dysfunction.  1. Left ventricular ejection fraction, by estimation, is 60 to 65%. The  left ventricle has normal function. The left ventricle has no regional  wall motion abnormalities. There is mild left ventricular hypertrophy.  Left ventricular diastolic parameters  are consistent with Grade I diastolic dysfunction (impaired relaxation).   2. Right ventricular systolic function is normal. The right ventricular  size is normal. There is normal pulmonary artery systolic pressure.   3. The mitral valve is normal in structure. No evidence of mitral valve  regurgitation. No evidence of mitral stenosis.   4. The aortic valve is normal in structure. Aortic valve regurgitation is  mild. No aortic stenosis is present.   5. The inferior vena cava is normal in size with greater than 50%  respiratory variability, suggesting right atrial pressure of 3 mmHg.  Past Medical History:  Diagnosis Date   Acute blood loss anemia 06/12/2015   Acute lower GI bleeding 06/12/2015   Asthma    Bronchiectasis with acute exacerbation ([place]) 11/17/2012   Bronchiectasis without acute exacerbation ([place]) 06/21/2010   Ct chest 2011:  bilat lower lobe bronchiectasis with tiny nodular infiltrates scattered in the area (?MAC colonization) Spirometry 2012:  No airflow obstruction Spirometry 2014:  Normal.  Spiro 2016:  Normal flows.     Cervicalgia 10/21/2017   Cholelithiasis 05/29/2015   Chronic midline low back pain without sciatica 10/21/2017  Coronary artery disease involving native coronary artery with angina pectoris ([place]) 11/14/2014   Overview:  1.Taxus Stent to the LAD 06/30/07 with patent previous LAD stents  2. Cath 05/21/2011 without restenosis or obstructive stenosis 3. Cath 03/04/13 with patent stent, EF 55-60%.   CVA (cerebral vascular accident) ([place])  2017   Diverticulitis of rectosigmoid 05/29/2015   Dyspnea    Essential hypertension 05/14/2010   Qualifier: Diagnosis of  By: Charma Igo     GERD (gastroesophageal reflux disease)    s/p esophageal dilation for stricture   History of low anterior resection of rectum 06/12/2015   Hyperlipidemia    Hypertension    Hypokalemia 06/12/2015   Hypophosphatemia 06/12/2015   Impingement syndrome of right shoulder 05/09/2016   Leukocytosis 06/12/2015   Mixed hyperlipidemia 02/29/2016   Overview:  Added automatically from request for surgery [id number]   MYOCARDIAL INFARCTION 05/14/2010   Qualifier: History of  By: Charma Igo     Myocardial infarction ([place])    +Sees Geana Walts. +CAD -recent nuclear scan with EF 40%, ?ischemia   Postoperative ileus ([place]) 06/05/2015   TIA (transient ischemic attack) 05/14/2010   Qualifier: Diagnosis of  By: Charma Igo      Past Surgical History:  Procedure Laterality Date   COLON SURGERY  2017   blood loss   CORONARY STENT PLACEMENT     x 3   JOINT REPLACEMENT Bilateral    hips   LEFT HEART CATH AND CORONARY ANGIOGRAPHY N/A 11/23/2018   Procedure: LEFT HEART CATH AND CORONARY ANGIOGRAPHY;  Surgeon: Burnell Blanks, MD;  Location: [facility];  Service: Cardiovascular;  Laterality: N/A;   REVERSE SHOULDER ARTHROPLASTY Right 04/28/2020   Procedure: REVERSE SHOULDER ARTHROPLASTY;  Surgeon: Netta Cedars, MD;  Location: [facility];  Service: Orthopedics;  Laterality: Right;   ROTATOR CUFF REPAIR     right   TOTAL HIP ARTHROPLASTY     x 2    Current Medications: Current Meds  Medication Sig   albuterol (VENTOLIN HFA) 108 (90 Base) MCG/ACT inhaler Inhale 2 puffs into the lungs every 6 (six) hours as needed (shortness of breath).   atorvastatin (LIPITOR) 40 MG tablet Take 40 mg by mouth daily.    baclofen (LIORESAL) 10 MG tablet Take 10 mg by mouth 3 (three) times daily as needed for spasms.   carvedilol (COREG) 12.5 MG tablet Take 12.5 mg by mouth 2 (two)  times daily with a meal.   cetirizine (ZYRTEC) 10 MG tablet Take 10 mg by mouth daily.    citalopram (CELEXA) 40 MG tablet Take 40 mg by mouth daily.   clopidogrel (PLAVIX) 75 MG tablet Take 1 tablet (75 mg total) by mouth daily.   docusate sodium (COLACE) 50 MG capsule Take 50 mg by mouth daily as needed for mild constipation.   esomeprazole (NEXIUM) 40 MG capsule Take 40 mg by mouth 2 (two) times daily.   fluticasone (FLONASE) 50 MCG/ACT nasal spray Place 2 sprays into both nostrils as needed for allergies or rhinitis.   isosorbide mononitrate (IMDUR) 60 MG 24 hr tablet Take 60 mg by mouth daily.   lactulose (CHRONULAC) 10 GM/15ML solution Take 30 g by mouth daily as needed for mild constipation or moderate constipation.    lidocaine (LIDODERM) 5 % Place 1 patch onto the skin daily as needed (pain). Remove & Discard patch within 12 hours or as directed by MD   linaclotide (LINZESS) 145 MCG CAPS capsule Take 145 mcg by mouth daily before breakfast.  methocarbamol (ROBAXIN) 500 MG tablet TAKE 1 TABLET BY MOUTH EVERY 6 HOURS AS NEEDED. (Patient taking differently: Take by mouth every 6 (six) hours as needed for muscle spasms.)   Multiple Vitamin (MULTIVITAMIN WITH MINERALS) TABS tablet Take 1 tablet by mouth daily.   nitroGLYCERIN (NITROSTAT) 0.4 MG SL tablet Place 0.4 mg under the tongue every 5 (five) minutes x 3 doses as needed for chest pain.   olopatadine (PATANOL) 0.1 % ophthalmic solution Place 1-2 drops into both eyes in the morning and at bedtime.   ondansetron (ZOFRAN) 4 MG tablet TAKE 1 TABLET BY MOUTH EVERY 8 HOURS AS NEEDED FOR NAUSEA OR VOMITING.   Oxycodone HCl 10 MG TABS Take 10 mg by mouth 4 (four) times daily as needed for pain.   ranolazine (RANEXA) 500 MG 12 hr tablet Take 500 mg by mouth 2 (two) times daily.   sildenafil (VIAGRA) 100 MG tablet Take 100 mg by mouth daily as needed for erectile dysfunction.   tamsulosin (FLOMAX) 0.4 MG CAPS capsule Take 0.4 mg by mouth at  bedtime.   testosterone cypionate (DEPOTESTOSTERONE CYPIONATE) 200 MG/ML injection Inject 200 mg into the muscle every 14 (fourteen) days.      Allergies:   Latex   Social History   Socioeconomic History   Marital status: Divorced    Spouse name: Not on file   Number of children: Not on file   Years of education: Not on file   Highest education level: Not on file  Occupational History   Occupation: disabled    Comment: Dealer    Comment: former Customer service manager "breaker"  Tobacco Use   Smoking status: Former    Types: Cigars    Quit date: 03/25/1988    Years since quitting: 33.0    Passive exposure: Past   Smokeless tobacco: Former    Types: Chew   Tobacco comments:    Smoked 6 cigars a day  Scientific laboratory technician Use: Never used  Substance and Sexual Activity   Alcohol use: Yes    Comment: occ   Drug use: Not Currently   Sexual activity: Not on file  Other Topics Concern   Not on file  Social History Narrative   Not on file   Social Determinants of Health   Financial Resource Strain: Not on file  Food Insecurity: Not on file  Transportation Needs: Not on file  Physical Activity: Not on file  Stress: Not on file  Social Connections: Not on file     Family History: The patient's family history includes Allergies in his mother; Hypertension in his mother; Rheum arthritis in his mother. ROS:   Please see the history of present illness.    All other systems reviewed and are negative.  EKGs/Labs/Other Studies Reviewed:    The following studies were reviewed today:  EKG:  EKG ordered today and personally reviewed.  The ekg ordered today demonstrates sinus rhythm and remains normal  Recent Labs: 04/25/2020: Hemoglobin 15.6; Platelets 124  Recent Lipid Panel    Component Value Date/Time   CHOL 182 01/19/2019 1117   TRIG 234 (H) 01/19/2019 1117   HDL 50 01/19/2019 1117   CHOLHDL 3.6 01/19/2019 1117   CHOLHDL 3.7 11/22/2018 0347   VLDL 28 11/22/2018 0347    LDLCALC 93 01/19/2019 1117    Physical Exam:    VS:  BP 140/78 (BP Location: Right Arm)    Pulse 63    Ht _0  (1.676 m)  Wt 197 lb (89.4 kg)    SpO2 97%    BMI 31.80 kg/m     Wt Readings from Last 3 Encounters:  04/16/21 197 lb (89.4 kg)  04/28/20 192 lb (87.1 kg)  04/20/20 191 lb (86.6 kg)     GEN:  Well nourished, well developed in no acute distress HEENT: Normal NECK: No JVD; No carotid bruits LYMPHATICS: No lymphadenopathy CARDIAC: RRR, no murmurs, rubs, gallops RESPIRATORY:  Clear to auscultation without rales, wheezing or rhonchi  ABDOMEN: Soft, non-tender, non-distended MUSCULOSKELETAL:  No edema; No deformity  SKIN: Warm and dry NEUROLOGIC:  Alert and oriented x 3 PSYCHIATRIC:  Normal affect    Signed, Shirlee More, MD  04/16/2021 8:42 AM    [place]

## 2021-04-16 ENCOUNTER — Encounter: Payer: Self-pay | Admitting: Cardiology

## 2021-04-16 ENCOUNTER — Ambulatory Visit (INDEPENDENT_AMBULATORY_CARE_PROVIDER_SITE_OTHER): Payer: Medicare Other | Admitting: Cardiology

## 2021-04-16 ENCOUNTER — Other Ambulatory Visit: Payer: Self-pay

## 2021-04-16 VITALS — BP 140/78 | HR 63 | Ht 66.0 in | Wt 197.0 lb

## 2021-04-16 DIAGNOSIS — E782 Mixed hyperlipidemia: Secondary | ICD-10-CM

## 2021-04-16 DIAGNOSIS — I25119 Atherosclerotic heart disease of native coronary artery with unspecified angina pectoris: Secondary | ICD-10-CM | POA: Diagnosis not present

## 2021-04-16 DIAGNOSIS — I10 Essential (primary) hypertension: Secondary | ICD-10-CM | POA: Diagnosis not present

## 2021-04-16 MED ORDER — NITROGLYCERIN 0.4 MG SL SUBL: 0.4000 mg | SUBLINGUAL_TABLET | SUBLINGUAL | 3 refills | Status: AC | PRN

## 2021-04-16 NOTE — Patient Instructions (Signed)

## 2021-06-20 ENCOUNTER — Telehealth: Payer: Self-pay

## 2021-06-20 NOTE — Telephone Encounter (Signed)
? ?  [facility] Pre-operative Risk Assessment  ?  ?Request for surgical clearance: ? ?What type of surgery is being performed? Colonoscopy  ? ?When is this surgery scheduled? TBD  ? ?What type of clearance is required (medical clearance vs. Pharmacy clearance to hold med vs. Both)? Both ? ?Are there any medications that need to be held prior to surgery and how long?{Plavix, holding length not specified.   ? ?Practice name and name of physician performing surgery?  [facility]   ? ?What is your office phone number: [phone] ?  ?7.   What is your office fax number: [phone] ? ?8.   Anesthesia type (None, local, MAC, general) ? Not specified ? ?FYI: The office is requesting a copy of the most recent Echo  ? ? ?Gary Diaz ?06/20/2021, 5:04 PM  ?_________________________________________________________________ ?  (provider comments below) ? ?

## 2021-06-20 NOTE — Telephone Encounter (Signed)
Attempted to call the patient without success. Dr. Dulce Sellar to review.  Patient has a history of CAD, hypertension, hyperlipidemia and ischemic cardiomyopathy.  Last cardiac catheterization performed in August 2020 demonstrated patent LAD stent, otherwise nonobstructive CAD in the RCA and left circumflex artery.  He was last seen in the office in January 2023 at which time he was doing well. ? ?As long as Gary Diaz does not have any anginal symptoms, would you be okay with him holding Plavix for 5 days prior to upcoming GI procedure? ? ?Please forward your response to [facility] ?

## 2021-06-21 NOTE — Telephone Encounter (Signed)
? ?  Primary Cardiologist: Norman Herrlich, MD ? ?Chart reviewed as part of pre-operative protocol coverage. Given past medical history and time since last visit, based on ACC/AHA guidelines, Gary Diaz would be at acceptable risk for the planned procedure without further cardiovascular testing.  ? ?His Plavix may be held for 5 days prior to his procedure.  Please resume as soon as hemostasis is achieved. ? ?I will route this recommendation to the requesting party via Epic fax function and remove from pre-op pool. ? ?Please call with questions. ? ?Thomasene Ripple. Donnis Phaneuf NP-C ? ?06/21/2021, 9:34 AM ?[facility] ?[address] ?Office [phone] Fax [phone] ? ? ? ? ?

## 2021-06-21 NOTE — Telephone Encounter (Signed)
May hold plavix for colonoscopy per Dr. Dulce Sellar.

## 2021-07-06 ENCOUNTER — Telehealth: Payer: Self-pay | Admitting: Cardiology

## 2021-07-06 NOTE — Telephone Encounter (Signed)
Gary Diaz requesting pt's ECHO results. Please advise ?

## 2021-07-06 NOTE — Telephone Encounter (Signed)
Echo sent via Epic to [facility]. ?

## 2022-07-23 NOTE — Progress Notes (Deleted)
Cardiology Office Note:    Date:  07/23/2022   ID:  Gary Diaz, DOB [birth year], MRN [id number]  PCP:  Christia Reading, PA-C  Cardiologist:  Norman Herrlich, MD    Referring MD: Christia Reading, PA-C    ASSESSMENT:    No diagnosis found. PLAN:    In order of problems listed above:  ***   Next appointment: ***   Medication Adjustments/Labs and Tests Ordered: Current medicines are reviewed at length with the patient today.  Concerns regarding medicines are outlined above.  No orders of the defined types were placed in this encounter.  No orders of the defined types were placed in this encounter.   No chief complaint on file.   History of Present Illness:    Gary Diaz is a 67 y.o. male with a hx of coronary artery disease hypertension hyperlipidemia left ventricular dysfunction  last seen 04/16/2021.  His last coronary angiogram August 2020 showed patent stent to the LAD and mild nonobstructive CAD in the right coronary and left circumflex coronary artery.  His last echocardiogram November 2021 showed EF improved normalized 60 to 65%. Compliance with diet, lifestyle and medications: *** Past Medical History:  Diagnosis Date   Acute blood loss anemia 06/12/2015   Acute lower GI bleeding 06/12/2015   Asthma    Bronchiectasis with acute exacerbation (HCC) 11/17/2012   Bronchiectasis without acute exacerbation (HCC) 06/21/2010   Ct chest 2011:  bilat lower lobe bronchiectasis with tiny nodular infiltrates scattered in the area (?MAC colonization) Spirometry 2012:  No airflow obstruction Spirometry 2014:  Normal.  Cleda Daub 2016:  Normal flows.     Cervicalgia 10/21/2017   Cholelithiasis 05/29/2015   Chronic midline low back pain without sciatica 10/21/2017   Coronary artery disease involving native coronary artery with angina pectoris (HCC) 11/14/2014   Overview:  1.Taxus Stent to the LAD 06/30/07 with patent previous LAD stents  2. Cath 05/21/2011 without restenosis  or obstructive stenosis 3. Cath 03/04/13 with patent stent, EF 55-60%.   CVA (cerebral vascular accident) (HCC) 2017   Diverticulitis of rectosigmoid 05/29/2015   Dyspnea    Essential hypertension 05/14/2010   Qualifier: Diagnosis of  By: Carver Fila     GERD (gastroesophageal reflux disease)    s/p esophageal dilation for stricture   History of low anterior resection of rectum 06/12/2015   Hyperlipidemia    Hypertension    Hypokalemia 06/12/2015   Hypophosphatemia 06/12/2015   Impingement syndrome of right shoulder 05/09/2016   Leukocytosis 06/12/2015   Mixed hyperlipidemia 02/29/2016   Overview:  Added automatically from request for surgery [id number]   MYOCARDIAL INFARCTION 05/14/2010   Qualifier: History of  By: Carver Fila     Myocardial infarction (HCC)    +Sees Khrystyne Arpin. +CAD -recent nuclear scan with EF 40%, ?ischemia   Postoperative ileus (HCC) 06/05/2015   TIA (transient ischemic attack) 05/14/2010   Qualifier: Diagnosis of  By: Carver Fila      Past Surgical History:  Procedure Laterality Date   COLON SURGERY  2017   blood loss   CORONARY STENT PLACEMENT     x 3   JOINT REPLACEMENT Bilateral    hips   LEFT HEART CATH AND CORONARY ANGIOGRAPHY N/A 11/23/2018   Procedure: LEFT HEART CATH AND CORONARY ANGIOGRAPHY;  Surgeon: Kathleene Hazel, MD;  Location: [facility];  Service: Cardiovascular;  Laterality: N/A;   REVERSE SHOULDER ARTHROPLASTY Right 04/28/2020   Procedure: REVERSE SHOULDER ARTHROPLASTY;  Surgeon: Beverely Low,  MD;  Location: [facility];  Service: Orthopedics;  Laterality: Right;   ROTATOR CUFF REPAIR     right   TOTAL HIP ARTHROPLASTY     x 2    Current Medications: No outpatient medications have been marked as taking for the 07/24/22 encounter (Appointment) with Baldo Daub, MD.     Allergies:   Latex   Social History   Socioeconomic History   Marital status: Divorced    Spouse name: Not on file   Number of children: Not on file   Years of  education: Not on file   Highest education level: Not on file  Occupational History   Occupation: disabled    Comment: Curator    Comment: former Paediatric nurse "breaker"  Tobacco Use   Smoking status: Former    Types: Cigars    Quit date: 03/25/1988    Years since quitting: 34.3    Passive exposure: Past   Smokeless tobacco: Former    Types: Chew   Tobacco comments:    Smoked 6 cigars a day  Building services engineer Use: Never used  Substance and Sexual Activity   Alcohol use: Yes    Comment: occ   Drug use: Not Currently   Sexual activity: Not on file  Other Topics Concern   Not on file  Social History Narrative   Not on file   Social Determinants of Health   Financial Resource Strain: Not on file  Food Insecurity: Not on file  Transportation Needs: Not on file  Physical Activity: Not on file  Stress: Not on file  Social Connections: Not on file     Family History: The patient's ***family history includes Allergies in his mother; Hypertension in his mother; Rheum arthritis in his mother. ROS:   Please see the history of present illness.    All other systems reviewed and are negative.  EKGs/Labs/Other Studies Reviewed:    The following studies were reviewed today:  Cardiac Studies & Procedures   CARDIAC CATHETERIZATION  CARDIAC CATHETERIZATION 11/23/2018  Narrative  Prox RCA lesion is 20% stenosed.  Dist RCA lesion is 20% stenosed.  Prox Cx to Mid Cx lesion is 40% stenosed.  Previously placed Prox LAD to Mid LAD stent (unknown type) is widely patent.  1. Mild non-obstructive disease in the RCA and circumflex 2. Patent mid LAD stents with no restenosis 3. Normal LV filling pressures  Recommendations: Medical management of CAD.  Findings Coronary Findings Diagnostic  Dominance: Right  Left Anterior Descending Vessel is large. Previously placed Prox LAD to Mid LAD stent (unknown type) is widely patent.  Left Circumflex Vessel is large. Prox Cx to  Mid Cx lesion is 40% stenosed.  Second Obtuse Marginal Branch Vessel is large in size.  Right Coronary Artery Vessel is large. Prox RCA lesion is 20% stenosed. Dist RCA lesion is 20% stenosed.  Intervention  No interventions have been documented.     ECHOCARDIOGRAM  ECHOCARDIOGRAM COMPLETE 03/09/2020  Narrative ECHOCARDIOGRAM REPORT    Patient Name:   Gary Diaz Date of Exam: 03/09/2020 Medical Rec #:  [id number]           Height:       67.0 in Accession #:    [id number]          Weight:       191.4 lb Date of Birth:  07/10/1955            BSA:  1.985 m Patient Age:    64 years            BP:           113/68 mmHg Patient Gender: M                   HR:           69 bpm. Exam Location:  [place]  Procedure: 2D Echo  Indications:    Coronary artery disease involving native coronary artery of native heart with angina pectoris (HCC) [I25.119 (ICD-10-CM)]  History:        Patient has prior history of Echocardiogram examinations, most recent 11/22/2018. Previous Myocardial Infarction, Stroke and TIA, Signs/Symptoms:Chest Pain; Risk Factors:Hypertension and Dyslipidemia.  Sonographer:    Louie Boston Referring Phys: [phone] Lanorris Kalisz J Euell Schiff  IMPRESSIONS   1. Left ventricular ejection fraction, by estimation, is 60 to 65%. The left ventricle has normal function. The left ventricle has no regional wall motion abnormalities. There is mild left ventricular hypertrophy. Left ventricular diastolic parameters are consistent with Grade I diastolic dysfunction (impaired relaxation). 2. Right ventricular systolic function is normal. The right ventricular size is normal. There is normal pulmonary artery systolic pressure. 3. The mitral valve is normal in structure. No evidence of mitral valve regurgitation. No evidence of mitral stenosis. 4. The aortic valve is normal in structure. Aortic valve regurgitation is mild. No aortic stenosis is present. 5. The inferior vena  cava is normal in size with greater than 50% respiratory variability, suggesting right atrial pressure of 3 mmHg.  FINDINGS Left Ventricle: Left ventricular ejection fraction, by estimation, is 60 to 65%. The left ventricle has normal function. The left ventricle has no regional wall motion abnormalities. The left ventricular internal cavity size was normal in size. There is mild left ventricular hypertrophy. Left ventricular diastolic parameters are consistent with Grade I diastolic dysfunction (impaired relaxation).  Right Ventricle: The right ventricular size is normal. No increase in right ventricular wall thickness. Right ventricular systolic function is normal. There is normal pulmonary artery systolic pressure. The tricuspid regurgitant velocity is 2.43 m/s, and with an assumed right atrial pressure of 3 mmHg, the estimated right ventricular systolic pressure is 26.6 mmHg.  Left Atrium: Left atrial size was normal in size.  Right Atrium: Right atrial size was normal in size.  Pericardium: There is no evidence of pericardial effusion.  Mitral Valve: The mitral valve is normal in structure. No evidence of mitral valve regurgitation. No evidence of mitral valve stenosis.  Tricuspid Valve: The tricuspid valve is normal in structure. Tricuspid valve regurgitation is trivial. No evidence of tricuspid stenosis.  Aortic Valve: The aortic valve is normal in structure. Aortic valve regurgitation is mild. Aortic regurgitation PHT measures 533 msec. No aortic stenosis is present.  Pulmonic Valve: The pulmonic valve was normal in structure. Pulmonic valve regurgitation is not visualized. No evidence of pulmonic stenosis.  Aorta: The aortic root is normal in size and structure.  Venous: The inferior vena cava is normal in size with greater than 50% respiratory variability, suggesting right atrial pressure of 3 mmHg.  IAS/Shunts: No atrial level shunt detected by color flow Doppler.   LEFT  VENTRICLE PLAX 2D LVIDd:         5.10 cm  Diastology LVIDs:         3.20 cm  LV e' medial:    7.94 cm/s LV PW:         1.30 cm  LV E/e'  medial:  6.9 LV IVS:        1.30 cm  LV e' lateral:   11.90 cm/s LVOT diam:     2.10 cm  LV E/e' lateral: 4.6 LV SV:         50 LV SV Index:   25 LVOT Area:     3.46 cm   RIGHT VENTRICLE             IVC RV S prime:     12.10 cm/s  IVC diam: 1.70 cm TAPSE (M-mode): 2.8 cm  LEFT ATRIUM             Index       RIGHT ATRIUM           Index LA diam:        3.50 cm 1.76 cm/m  RA Area:     12.80 cm LA Vol (A2C):   40.4 ml 20.35 ml/m RA Volume:   27.50 ml  13.86 ml/m LA Vol (A4C):   35.4 ml 17.84 ml/m LA Biplane Vol: 40.5 ml 20.40 ml/m AORTIC VALVE LVOT Vmax:   69.00 cm/s LVOT Vmean:  52.500 cm/s LVOT VTI:    0.143 m AI PHT:      533 msec  AORTA Ao Root diam: 3.40 cm Ao Asc diam:  3.70 cm Ao Desc diam: 2.00 cm  MITRAL VALVE               TRICUSPID VALVE MV Area (PHT): 3.95 cm    TR Peak grad:   23.6 mmHg MV Decel Time: 192 msec    TR Vmax:        243.00 cm/s MV E velocity: 54.85 cm/s MV A velocity: 50.65 cm/s  SHUNTS MV E/A ratio:  1.08        Systemic VTI:  0.14 m Systemic Diam: 2.10 cm  Gypsy Balsam MD Electronically signed by Gypsy Balsam MD Signature Date/Time: 03/09/2020/4:20:09 PM    Final             EKG:  EKG ordered today and personally reviewed.  The ekg ordered today demonstrates ***  Recent Labs: No results found for requested labs within last 365 days.  Recent Lipid Panel    Component Value Date/Time   CHOL 182 01/19/2019 1117   TRIG 234 (H) 01/19/2019 1117   HDL 50 01/19/2019 1117   CHOLHDL 3.6 01/19/2019 1117   CHOLHDL 3.7 11/22/2018 0347   VLDL 28 11/22/2018 0347   LDLCALC 93 01/19/2019 1117    Physical Exam:    VS:  There were no vitals taken for this visit.    Wt Readings from Last 3 Encounters:  04/16/21 197 lb (89.4 kg)  04/28/20 192 lb (87.1 kg)  04/20/20 191 lb (86.6 kg)      GEN: *** Well nourished, well developed in no acute distress HEENT: Normal NECK: No JVD; No carotid bruits LYMPHATICS: No lymphadenopathy CARDIAC: ***RRR, no murmurs, rubs, gallops RESPIRATORY:  Clear to auscultation without rales, wheezing or rhonchi  ABDOMEN: Soft, non-tender, non-distended MUSCULOSKELETAL:  No edema; No deformity  SKIN: Warm and dry NEUROLOGIC:  Alert and oriented x 3 PSYCHIATRIC:  Normal affect    Signed, Norman Herrlich, MD  07/23/2022 5:40 PM    [facility]

## 2022-07-24 ENCOUNTER — Ambulatory Visit: Payer: 59 | Admitting: Cardiology

## 2022-07-24 ENCOUNTER — Ambulatory Visit: Payer: 59 | Attending: Cardiology | Admitting: Cardiology

## 2022-07-24 ENCOUNTER — Other Ambulatory Visit: Payer: Self-pay

## 2022-07-24 ENCOUNTER — Encounter: Payer: Self-pay | Admitting: Cardiology

## 2022-07-24 VITALS — BP 122/76 | HR 76 | Ht 66.0 in | Wt 173.2 lb

## 2022-07-24 DIAGNOSIS — I25119 Atherosclerotic heart disease of native coronary artery with unspecified angina pectoris: Secondary | ICD-10-CM | POA: Diagnosis not present

## 2022-07-24 DIAGNOSIS — E782 Mixed hyperlipidemia: Secondary | ICD-10-CM | POA: Diagnosis not present

## 2022-07-24 DIAGNOSIS — I10 Essential (primary) hypertension: Secondary | ICD-10-CM

## 2022-07-24 DIAGNOSIS — N529 Male erectile dysfunction, unspecified: Secondary | ICD-10-CM

## 2022-07-24 MED ORDER — SILDENAFIL CITRATE 100 MG PO TABS: 100.0000 mg | ORAL_TABLET | Freq: Every day | ORAL | 1 refills | Status: AC | PRN

## 2022-07-24 NOTE — Progress Notes (Signed)
Cardiology Office Note:    Date:  07/24/2022   ID:  Gary Diaz, DOB 1955-04-11, MRN [id number]  PCP:  Gary Reading, PA-C  Cardiologist:  Gary Herrlich, MD    Referring MD: Gary Reading, PA-C    ASSESSMENT:    1. Coronary artery disease involving native coronary artery of native heart with angina pectoris (HCC)   2. Essential hypertension   3. Mixed hyperlipidemia   4. Erectile dysfunction, unspecified erectile dysfunction type    PLAN:    In order of problems listed above:  He is doing quite well with CAD no anginal discomfort on appropriate medical therapy including clopidogrel lipid-lowering with high intensity statin he is unsure but I doubt that he is taking Imdur regardless.  Will continue his beta-blocker.  At this time he does not require an ischemic evaluation I will await the labs requested just done this week in his PCP office as well as EKG Stable controlled continue current treatment carvedilol Continue his high intensity statin with CAD I will renew his Viagra he says his PCP wanted me to see him in the office first   Next appointment: 1 year   Medication Adjustments/Labs and Tests Ordered: Current medicines are reviewed at length with the patient today.  Concerns regarding medicines are outlined above.  No orders of the defined types were placed in this encounter.  No orders of the defined types were placed in this encounter.   Chief Complaint  Patient presents with   Follow-up   Coronary Artery Disease   Cardiomyopathy    History of Present Illness:    Gary Diaz is a 67 y.o. male with a hx of coronary artery disease hypertension hyperlipidemia left ventricular dysfunction  last seen 04/16/2021.  His last coronary angiogram August 2020 showed patent stent to the LAD and mild nonobstructive CAD in the right coronary and left circumflex coronary artery.  His last echocardiogram November 2021 showed EF improved normalized 60 to  65%.  He is pleased with the quality of his life he is lost 30 pounds since May demonstrating improvement he is not having cardiovascular symptoms of angina edema shortness of breath palpitation or syncope  He uses Viagra he is very careful not to take nitroglycerin I reviewed his medications with him and I doubt he is taking Imdur if he is we will have him stop and I will renew his Viagra  He just had labs and EKG in his PCP office he tells me the labs were all good target his EKG was good and will request a copy I cannot see it in Care Everywhere Past Medical History:  Diagnosis Date   Acute blood loss anemia 06/12/2015   Acute lower GI bleeding 06/12/2015   Asthma    Bronchiectasis with acute exacerbation (HCC) 11/17/2012   Bronchiectasis without acute exacerbation (HCC) 06/21/2010   Ct chest 2011:  bilat lower lobe bronchiectasis with tiny nodular infiltrates scattered in the area (?MAC colonization) Spirometry 2012:  No airflow obstruction Spirometry 2014:  Normal.  Cleda Daub 2016:  Normal flows.     Cervicalgia 10/21/2017   Cholelithiasis 05/29/2015   Chronic midline low back pain without sciatica 10/21/2017   Coronary artery disease involving native coronary artery with angina pectoris (HCC) 11/14/2014   Overview:  1.Taxus Stent to the LAD 06/30/07 with patent previous LAD stents  2. Cath 05/21/2011 without restenosis or obstructive stenosis 3. Cath 03/04/13 with patent stent, EF 55-60%.   CVA (cerebral vascular accident) (HCC)  2017   Diverticulitis of rectosigmoid 05/29/2015   Dyspnea    Essential hypertension 05/14/2010   Qualifier: Diagnosis of  By: Carver Fila     GERD (gastroesophageal reflux disease)    s/p esophageal dilation for stricture   History of low anterior resection of rectum 06/12/2015   Hyperlipidemia    Hypertension    Hypokalemia 06/12/2015   Hypophosphatemia 06/12/2015   Impingement syndrome of right shoulder 05/09/2016   Leukocytosis 06/12/2015   Mixed hyperlipidemia  02/29/2016   Overview:  Added automatically from request for surgery [id number]   MYOCARDIAL INFARCTION 05/14/2010   Qualifier: History of  By: Carver Fila     Myocardial infarction (HCC)    +Sees Sherel Fennell. +CAD -recent nuclear scan with EF 40%, ?ischemia   Postoperative ileus (HCC) 06/05/2015   TIA (transient ischemic attack) 05/14/2010   Qualifier: Diagnosis of  By: Carver Fila      Past Surgical History:  Procedure Laterality Date   COLON SURGERY  2017   blood loss   CORONARY STENT PLACEMENT     x 3   JOINT REPLACEMENT Bilateral    hips   LEFT HEART CATH AND CORONARY ANGIOGRAPHY N/A 11/23/2018   Procedure: LEFT HEART CATH AND CORONARY ANGIOGRAPHY;  Surgeon: Kathleene Hazel, MD;  Location: [facility];  Service: Cardiovascular;  Laterality: N/A;   REVERSE SHOULDER ARTHROPLASTY Right 04/28/2020   Procedure: REVERSE SHOULDER ARTHROPLASTY;  Surgeon: Beverely Low, MD;  Location: [facility];  Service: Orthopedics;  Laterality: Right;   ROTATOR CUFF REPAIR     right   TOTAL HIP ARTHROPLASTY     x 2    Current Medications: Current Meds  Medication Sig   albuterol (VENTOLIN HFA) 108 (90 Base) MCG/ACT inhaler Inhale 2 puffs into the lungs every 6 (six) hours as needed (shortness of breath).   atorvastatin (LIPITOR) 40 MG tablet Take 40 mg by mouth daily.    baclofen (LIORESAL) 10 MG tablet Take 10 mg by mouth 3 (three) times daily as needed for spasms.   carvedilol (COREG) 12.5 MG tablet Take 12.5 mg by mouth 2 (two) times daily with a meal.   cetirizine (ZYRTEC) 10 MG tablet Take 10 mg by mouth daily.    citalopram (CELEXA) 40 MG tablet Take 40 mg by mouth daily.   clopidogrel (PLAVIX) 75 MG tablet Take 1 tablet (75 mg total) by mouth daily.   docusate sodium (COLACE) 50 MG capsule Take 50 mg by mouth daily as needed for mild constipation.   esomeprazole (NEXIUM) 40 MG capsule Take 40 mg by mouth 2 (two) times daily.   fluticasone (FLONASE) 50 MCG/ACT nasal spray Place 2 sprays  into both nostrils as needed for allergies or rhinitis.   isosorbide mononitrate (IMDUR) 60 MG 24 hr tablet Take 60 mg by mouth daily.   lactulose (CHRONULAC) 10 GM/15ML solution Take 30 g by mouth daily as needed for mild constipation or moderate constipation.    lidocaine (LIDODERM) 5 % Place 1 patch onto the skin daily as needed (pain). Remove & Discard patch within 12 hours or as directed by MD   linaclotide (LINZESS) 145 MCG CAPS capsule Take 145 mcg by mouth daily before breakfast.   Multiple Vitamin (MULTIVITAMIN WITH MINERALS) TABS tablet Take 1 tablet by mouth daily.   nitroGLYCERIN (NITROSTAT) 0.4 MG SL tablet Place 1 tablet (0.4 mg total) under the tongue every 5 (five) minutes x 3 doses as needed for chest pain.   olopatadine (PATANOL) 0.1 % ophthalmic  solution Place 1-2 drops into both eyes in the morning and at bedtime.   oxyCODONE (ROXICODONE) 15 MG immediate release tablet Take 15 mg by mouth every 6 (six) hours as needed for pain.   ranolazine (RANEXA) 500 MG 12 hr tablet Take 500 mg by mouth 2 (two) times daily.   sildenafil (VIAGRA) 100 MG tablet Take 100 mg by mouth daily as needed for erectile dysfunction.   tamsulosin (FLOMAX) 0.4 MG CAPS capsule Take 0.4 mg by mouth at bedtime.   testosterone cypionate (DEPOTESTOSTERONE CYPIONATE) 200 MG/ML injection Inject 200 mg into the muscle every 14 (fourteen) days.      Allergies:   Latex   Social History   Socioeconomic History   Marital status: Divorced    Spouse name: Not on file   Number of children: Not on file   Years of education: Not on file   Highest education level: Not on file  Occupational History   Occupation: disabled    Comment: Curator    Comment: former Paediatric nurse "breaker"  Tobacco Use   Smoking status: Former    Types: Cigars    Quit date: 03/25/1988    Years since quitting: 34.3    Passive exposure: Past   Smokeless tobacco: Former    Types: Chew   Tobacco comments:    Smoked 6 cigars a day   Building services engineer Use: Never used  Substance and Sexual Activity   Alcohol use: Yes    Comment: occ   Drug use: Not Currently   Sexual activity: Not on file  Other Topics Concern   Not on file  Social History Narrative   Not on file   Social Determinants of Health   Financial Resource Strain: Not on file  Food Insecurity: Not on file  Transportation Needs: Not on file  Physical Activity: Not on file  Stress: Not on file  Social Connections: Not on file     Family History: The patient's family history includes Allergies in his mother; Hypertension in his mother; Rheum arthritis in his mother. ROS:   Please see the history of present illness.    All other systems reviewed and are negative.  EKGs/Labs/Other Studies Reviewed:    The following studies were reviewed today:  Cardiac Studies & Procedures   CARDIAC CATHETERIZATION  CARDIAC CATHETERIZATION 11/23/2018  Narrative  Prox RCA lesion is 20% stenosed.  Dist RCA lesion is 20% stenosed.  Prox Cx to Mid Cx lesion is 40% stenosed.  Previously placed Prox LAD to Mid LAD stent (unknown type) is widely patent.  1. Mild non-obstructive disease in the RCA and circumflex 2. Patent mid LAD stents with no restenosis 3. Normal LV filling pressures  Recommendations: Medical management of CAD.  Findings Coronary Findings Diagnostic  Dominance: Right  Left Anterior Descending Vessel is large. Previously placed Prox LAD to Mid LAD stent (unknown type) is widely patent.  Left Circumflex Vessel is large. Prox Cx to Mid Cx lesion is 40% stenosed.  Second Obtuse Marginal Branch Vessel is large in size.  Right Coronary Artery Vessel is large. Prox RCA lesion is 20% stenosed. Dist RCA lesion is 20% stenosed.  Intervention  No interventions have been documented.     ECHOCARDIOGRAM  ECHOCARDIOGRAM COMPLETE 03/09/2020  Narrative ECHOCARDIOGRAM REPORT    Patient Name:   SHASHANK KWASNIK Date of Exam:  03/09/2020 Medical Rec #:  [id number]           Height:  67.0 in Accession #:    [id number]          Weight:       191.4 lb Date of Birth:  1955-09-09            BSA:          1.985 m Patient Age:    64 years            BP:           113/68 mmHg Patient Gender: M                   HR:           69 bpm. Exam Location:  [facility]  Procedure: 2D Echo  Indications:    Coronary artery disease involving native coronary artery of native heart with angina pectoris (HCC) [I25.119 (ICD-10-CM)]  History:        Patient has prior history of Echocardiogram examinations, most recent 11/22/2018. Previous Myocardial Infarction, Stroke and TIA, Signs/Symptoms:Chest Pain; Risk Factors:Hypertension and Dyslipidemia.  Sonographer:    Louie Boston Referring Phys: [phone] Aleera Gilcrease J Paysley Poplar  IMPRESSIONS   1. Left ventricular ejection fraction, by estimation, is 60 to 65%. The left ventricle has normal function. The left ventricle has no regional wall motion abnormalities. There is mild left ventricular hypertrophy. Left ventricular diastolic parameters are consistent with Grade I diastolic dysfunction (impaired relaxation). 2. Right ventricular systolic function is normal. The right ventricular size is normal. There is normal pulmonary artery systolic pressure. 3. The mitral valve is normal in structure. No evidence of mitral valve regurgitation. No evidence of mitral stenosis. 4. The aortic valve is normal in structure. Aortic valve regurgitation is mild. No aortic stenosis is present. 5. The inferior vena cava is normal in size with greater than 50% respiratory variability, suggesting right atrial pressure of 3 mmHg.  FINDINGS Left Ventricle: Left ventricular ejection fraction, by estimation, is 60 to 65%. The left ventricle has normal function. The left ventricle has no regional wall motion abnormalities. The left ventricular internal cavity size was normal in size. There is mild left ventricular  hypertrophy. Left ventricular diastolic parameters are consistent with Grade I diastolic dysfunction (impaired relaxation).  Right Ventricle: The right ventricular size is normal. No increase in right ventricular wall thickness. Right ventricular systolic function is normal. There is normal pulmonary artery systolic pressure. The tricuspid regurgitant velocity is 2.43 m/s, and with an assumed right atrial pressure of 3 mmHg, the estimated right ventricular systolic pressure is 26.6 mmHg.  Left Atrium: Left atrial size was normal in size.  Right Atrium: Right atrial size was normal in size.  Pericardium: There is no evidence of pericardial effusion.  Mitral Valve: The mitral valve is normal in structure. No evidence of mitral valve regurgitation. No evidence of mitral valve stenosis.  Tricuspid Valve: The tricuspid valve is normal in structure. Tricuspid valve regurgitation is trivial. No evidence of tricuspid stenosis.  Aortic Valve: The aortic valve is normal in structure. Aortic valve regurgitation is mild. Aortic regurgitation PHT measures 533 msec. No aortic stenosis is present.  Pulmonic Valve: The pulmonic valve was normal in structure. Pulmonic valve regurgitation is not visualized. No evidence of pulmonic stenosis.  Aorta: The aortic root is normal in size and structure.  Venous: The inferior vena cava is normal in size with greater than 50% respiratory variability, suggesting right atrial pressure of 3 mmHg.  IAS/Shunts: No atrial level shunt detected by color flow Doppler.   LEFT  VENTRICLE PLAX 2D LVIDd:         5.10 cm  Diastology LVIDs:         3.20 cm  LV e' medial:    7.94 cm/s LV PW:         1.30 cm  LV E/e' medial:  6.9 LV IVS:        1.30 cm  LV e' lateral:   11.90 cm/s LVOT diam:     2.10 cm  LV E/e' lateral: 4.6 LV SV:         50 LV SV Index:   25 LVOT Area:     3.46 cm   RIGHT VENTRICLE             IVC RV S prime:     12.10 cm/s  IVC diam: 1.70 cm TAPSE  (M-mode): 2.8 cm  LEFT ATRIUM             Index       RIGHT ATRIUM           Index LA diam:        3.50 cm 1.76 cm/m  RA Area:     12.80 cm LA Vol (A2C):   40.4 ml 20.35 ml/m RA Volume:   27.50 ml  13.86 ml/m LA Vol (A4C):   35.4 ml 17.84 ml/m LA Biplane Vol: 40.5 ml 20.40 ml/m AORTIC VALVE LVOT Vmax:   69.00 cm/s LVOT Vmean:  52.500 cm/s LVOT VTI:    0.143 m AI PHT:      533 msec  AORTA Ao Root diam: 3.40 cm Ao Asc diam:  3.70 cm Ao Desc diam: 2.00 cm  MITRAL VALVE               TRICUSPID VALVE MV Area (PHT): 3.95 cm    TR Peak grad:   23.6 mmHg MV Decel Time: 192 msec    TR Vmax:        243.00 cm/s MV E velocity: 54.85 cm/s MV A velocity: 50.65 cm/s  SHUNTS MV E/A ratio:  1.08        Systemic VTI:  0.14 m Systemic Diam: 2.10 cm  Gypsy Balsam MD Electronically signed by Gypsy Balsam MD Signature Date/Time: 03/09/2020/4:20:09 PM    Final             EKG: requested from his PCP  Recent Labs: Requested from his PCP   Physical Exam:    VS:  BP 122/76 (BP Location: Right Arm, Patient Position: Sitting, Cuff Size: Normal)   Pulse 76   Ht 5\' 6"  (1.676 m)   Wt 173 lb 3.2 oz (78.6 kg)   SpO2 96%   BMI 27.96 kg/m     Wt Readings from Last 3 Encounters:  07/24/22 173 lb 3.2 oz (78.6 kg)  04/16/21 197 lb (89.4 kg)  04/28/20 192 lb (87.1 kg)     GEN:  Well nourished, well developed in no acute distress HEENT: Normal NECK: No JVD; No carotid bruits LYMPHATICS: No lymphadenopathy CARDIAC: RRR, no murmurs, rubs, gallops RESPIRATORY:  Clear to auscultation without rales, wheezing or rhonchi  ABDOMEN: Soft, non-tender, non-distended MUSCULOSKELETAL:  No edema; No deformity  SKIN: Warm and dry NEUROLOGIC:  Alert and oriented x 3 PSYCHIATRIC:  Normal affect    Signed, Gary Herrlich, MD  07/24/2022 9:57 AM    [facility]

## 2022-07-24 NOTE — Patient Instructions (Addendum)
Medication Instructions:   STOP: Imdur START: Viagra 100 mg daily PRN  *If you need a refill on your cardiac medications before your next appointment, please call your pharmacy*   Lab Work: None If you have labs (blood work) drawn today and your tests are completely normal, you will receive your results only by: MyChart Message (if you have MyChart) OR A paper copy in the mail If you have any lab test that is abnormal or we need to change your treatment, we will call you to review the results.   Testing/Procedures: None   Follow-Up: At [facility], you and your health needs are our priority.  As part of our continuing mission to provide you with exceptional heart care, we have created designated Provider Care Teams.  These Care Teams include your primary Cardiologist (physician) and Advanced Practice Providers (APPs -  Physician Assistants and Nurse Practitioners) who all work together to provide you with the care you need, when you need it.  We recommend signing up for the patient portal called "MyChart".  Sign up information is provided on this After Visit Summary.  MyChart is used to connect with patients for Virtual Visits (Telemedicine).  Patients are able to view lab/test results, encounter notes, upcoming appointments, etc.  Non-urgent messages can be sent to your provider as well.   To learn more about what you can do with MyChart, go to [email or web address].    Your next appointment:   1 year(s)  Provider:   Norman Herrlich, MD    Other Instructions None

## 2023-01-08 DIAGNOSIS — I351 Nonrheumatic aortic (valve) insufficiency: Secondary | ICD-10-CM | POA: Diagnosis not present

## 2023-06-17 ENCOUNTER — Other Ambulatory Visit (HOSPITAL_BASED_OUTPATIENT_CLINIC_OR_DEPARTMENT_OTHER): Payer: Self-pay

## 2023-06-17 MED ORDER — OXYCODONE HCL 15 MG PO TABS
15.0000 mg | ORAL_TABLET | Freq: Four times a day (QID) | ORAL | 0 refills | Status: DC | PRN
  Filled 2023-06-20: qty 120, 30d supply, fill #0

## 2023-06-20 ENCOUNTER — Other Ambulatory Visit (HOSPITAL_BASED_OUTPATIENT_CLINIC_OR_DEPARTMENT_OTHER): Payer: Self-pay

## 2023-06-20 MED ORDER — ESOMEPRAZOLE MAGNESIUM 40 MG PO CPDR
40.0000 mg | DELAYED_RELEASE_CAPSULE | Freq: Two times a day (BID) | ORAL | 1 refills | Status: DC
  Filled 2023-06-20 – 2023-07-21 (×2): qty 180, 90d supply, fill #0

## 2023-06-20 MED ORDER — UBRELVY 100 MG PO TABS
100.0000 mg | ORAL_TABLET | ORAL | 1 refills | Status: DC
  Filled 2023-07-21: qty 16, 30d supply, fill #0

## 2023-06-20 MED ORDER — CETIRIZINE HCL 10 MG PO TABS: 10.0000 mg | ORAL_TABLET | Freq: Every day | ORAL | 1 refills | Status: DC

## 2023-06-20 MED ORDER — TAMSULOSIN HCL 0.4 MG PO CAPS: 0.4000 mg | ORAL_CAPSULE | Freq: Every evening | ORAL | 1 refills | Status: DC

## 2023-06-20 MED ORDER — RANOLAZINE ER 500 MG PO TB12
500.0000 mg | ORAL_TABLET | Freq: Two times a day (BID) | ORAL | 1 refills | Status: DC
  Filled 2023-07-21 – 2023-12-15 (×2): qty 180, 90d supply, fill #0

## 2023-06-20 MED ORDER — CARVEDILOL 12.5 MG PO TABS: 12.5000 mg | ORAL_TABLET | Freq: Two times a day (BID) | ORAL | 0 refills | Status: DC

## 2023-06-20 MED ORDER — ATORVASTATIN CALCIUM 40 MG PO TABS
40.0000 mg | ORAL_TABLET | Freq: Every evening | ORAL | 1 refills | Status: DC
  Filled 2023-06-20 – 2023-07-21 (×2): qty 90, 90d supply, fill #0

## 2023-06-20 MED ORDER — UBRELVY 100 MG PO TABS: 100.0000 mg | ORAL_TABLET | ORAL | 5 refills | Status: DC | PRN

## 2023-06-20 MED ORDER — RANOLAZINE ER 500 MG PO TB12
500.0000 mg | ORAL_TABLET | Freq: Two times a day (BID) | ORAL | 1 refills | Status: DC
  Filled 2023-06-20: qty 180, 90d supply, fill #0

## 2023-06-20 MED ORDER — LACTULOSE 10 GM/15ML PO SOLN
10.0000 g | Freq: Every day | ORAL | 4 refills | Status: AC | PRN
  Filled 2023-06-20: qty 450, 30d supply, fill #0

## 2023-06-20 MED ORDER — CLOPIDOGREL BISULFATE 75 MG PO TABS: 75.0000 mg | ORAL_TABLET | Freq: Every day | ORAL | 1 refills | Status: DC

## 2023-06-20 MED ORDER — CETIRIZINE HCL 10 MG PO TABS
10.0000 mg | ORAL_TABLET | Freq: Every day | ORAL | 1 refills | Status: DC
  Filled 2023-06-20: qty 90, 90d supply, fill #0

## 2023-06-20 MED ORDER — OLOPATADINE HCL 0.1 % OP SOLN: 1.0000 [drp] | Freq: Two times a day (BID) | OPHTHALMIC | 3 refills | Status: DC

## 2023-06-20 MED ORDER — UBRELVY 100 MG PO TABS
100.0000 mg | ORAL_TABLET | ORAL | 3 refills | Status: DC
  Filled 2023-11-28: qty 16, 30d supply, fill #0

## 2023-07-16 ENCOUNTER — Other Ambulatory Visit: Payer: Self-pay

## 2023-07-18 ENCOUNTER — Ambulatory Visit

## 2023-07-18 VITALS — BP 126/66 | HR 71 | Ht 66.0 in | Wt 178.0 lb

## 2023-07-18 DIAGNOSIS — I10 Essential (primary) hypertension: Secondary | ICD-10-CM

## 2023-07-18 DIAGNOSIS — E782 Mixed hyperlipidemia: Secondary | ICD-10-CM

## 2023-07-18 DIAGNOSIS — I25119 Atherosclerotic heart disease of native coronary artery with unspecified angina pectoris: Secondary | ICD-10-CM | POA: Diagnosis not present

## 2023-07-18 MED ORDER — CARVEDILOL 12.5 MG PO TABS: 12.5000 mg | ORAL_TABLET | Freq: Two times a day (BID) | ORAL | 3 refills | Status: DC

## 2023-07-18 NOTE — Progress Notes (Signed)
 Cardiology Consultation:    Date:  07/18/2023   ID:  ALIOU MEALEY, DOB 09-23-1955, MRN [id number]  PCP:  Josiephine Nightingale, PA-C  Cardiologist:  Angelena Kells, MD   Referring MD: Josiephine Nightingale, PA-C   No chief complaint on file.    ASSESSMENT AND PLAN:   Gary Diaz 68 year old male  History of CAD prior PCI's and last cardiac cath August 2020 with patent LAD stent, hypertension, hyperlipidemia, normal biventricular function with LVEF 55 to 60% and grade 2 diastolic dysfunction on echocardiogram October 2024 at [facility], chronic back pain controlled on oxycodone .  Former smoker quit 40 years ago.  Occasional alcohol consumption. Here for routine follow-up visit. No active symptoms.  Remains at his baseline functional status. Notably he continues to take Imdur  which was recommended to be discontinued at last office visit with us  in view of his ongoing use of phosphodiesterase inhibitors for erectile dysfunction.   Problem List Items Addressed This Visit     Essential hypertension   Well-controlled. Target below 130/80 mmHg. Continue carvedilol  12.5 mg twice daily       Relevant Medications   isosorbide  mononitrate (IMDUR ) 60 MG 24 hr tablet   carvedilol  (COREG ) 12.5 MG tablet   Coronary artery disease involving native coronary artery with angina pectoris (HCC) - Primary   Doing well at baseline. CCS class I. Continue Plavix  75 mg once daily. Mentions he cannot tolerate aspirin  due to epistaxis.  On antianginal regimen with carvedilol  12.5 mg twice daily Ranexa  500 mg twice daily Has been continuing to take Imdur  however this was recommended were discontinued at last visit in the office Cialis and Viagra  use. Discontinue Imdur . I suspect he will do fine without Imdur  given his good functional status in fact his lightheadedness could have been related to ongoing use of Imdur .  Cautioned him extensively that Imdur  should not be used  along with Viagra . Marked the bottle for him to put aside once he returns home.       Relevant Medications   isosorbide  mononitrate (IMDUR ) 60 MG 24 hr tablet   carvedilol  (COREG ) 12.5 MG tablet   Other Relevant Orders   EKG 12-Lead (Completed)   Mixed hyperlipidemia   No recent lipid panel. Currently not fasting. Continue atorvastatin  40 mg once daily. Will consider fasting lipid panel at subsequent follow-up visit in 6 months if he has not had one through his PCPs and his annual visit.       Relevant Medications   isosorbide  mononitrate (IMDUR ) 60 MG 24 hr tablet   carvedilol  (COREG ) 12.5 MG tablet    Return to tentatively in 6 months.  History of Present Illness:    Gary Diaz is a 68 y.o. male who is being seen today for follow-up visit. PCP is Josiephine Nightingale, PA-C. Last visit with Dr. Sandee Crook at our office was 07-24-2022.  History of CAD prior PCI's and last cardiac cath August 2020 with patent LAD stent, hypertension, hyperlipidemia, normal biventricular function with LVEF 55 to 60% and grade 2 diastolic dysfunction on echocardiogram October 2024 at [facility], chronic back pain and uses oxycodone .  Former smoker quit 40 years ago.  Occasional alcohol consumption.  Lives by himself at home.  Mentions has been doing well.  Keeps himself busy with work around the house. Denies any chest pain, shortness of breath.  At times when he overexerts he can get slightly out of breath. Positional changes at times cause lightheadedness.  Few months ago while he was exerting outdoors digging up a bush he briefly lost consciousness as he felt lightheaded.  Has not had any further recurrence. Denies any blood in urine or stools. Denies any pedal edema.  Mentions he has been taking medications consistently. Brought his medications to the office today. He does in fact continues to take Imdur  60 mg once daily. When asked about Viagra  or Cialis he mentions that he  last used 1 about 3 months ago and has not been using it regularly. He remembers vaguely the plan from Dr. Sandee Crook last visit to stop Imdur  but however he mentions he forgot and put the bottle back in with his current medications and had been taking.  EKG in the clinic today shows sinus rhythm heart rate 71/min, PR interval 144 ms, QRS duration 94 ms, QTc 465 ms.  Inferior Q waves appearance appears chronic and, present to prior EKGs.  Past Medical History:  Diagnosis Date   Acute blood loss anemia 06/12/2015   Acute lower GI bleeding 06/12/2015   Acute myocardial infarction [facility]) 05/14/2010   Qualifier: History of   By: Deforest Fast      IMO SNOMED Dx Update Oct 2024     Asthma    Bronchiectasis with acute exacerbation (HCC) 11/17/2012   Bronchiectasis without acute exacerbation (HCC) 06/21/2010   Ct chest 2011:  bilat lower lobe bronchiectasis with tiny nodular infiltrates scattered in the area (?MAC colonization) Spirometry 2012:  No airflow obstruction Spirometry 2014:  Normal.  [employer] 2016:  Normal flows.     Cervicalgia 10/21/2017   Chest pain of uncertain etiology    Cholelithiasis 05/29/2015   Chronic midline low back pain without sciatica 10/21/2017   Coronary artery disease involving native coronary artery with angina pectoris (HCC) 11/14/2014   Overview:  1.Taxus Stent to the LAD 06/30/07 with patent previous LAD stents  2. Cath 05/21/2011 without restenosis or obstructive stenosis 3. Cath 03/04/13 with patent stent, EF 55-60%.   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   TIA (transient ischemic attack) 05/14/2010   Qualifier: Diagnosis of  By: Deforest Fast      Past Surgical History:  Procedure Laterality Date   COLON SURGERY  2017   blood loss   CORONARY STENT PLACEMENT     x 3   JOINT REPLACEMENT Bilateral    hips   LEFT HEART CATH AND CORONARY ANGIOGRAPHY N/A 11/23/2018   Procedure: LEFT HEART CATH AND CORONARY ANGIOGRAPHY;  Surgeon: Odie Benne, MD;  Location: [facility] INVASIVE CV LAB;  Service: Cardiovascular;  Laterality: N/A;  REVERSE SHOULDER ARTHROPLASTY Right 04/28/2020   Procedure: REVERSE SHOULDER ARTHROPLASTY;  Surgeon: Winston Hawking, MD;  Location: [facility] ORS;  Service: Orthopedics;  Laterality: Right;   ROTATOR CUFF REPAIR     right   TOTAL HIP ARTHROPLASTY     x 2    Current Medications: Current Meds  Medication Sig   albuterol  (VENTOLIN  HFA) 108 (90 Base) MCG/ACT inhaler Inhale 2 puffs into the lungs every 6 (six) hours as needed (shortness of breath).   atorvastatin  (LIPITOR ) 40 MG tablet Take 1 tablet (40 mg total) by mouth every evening.   baclofen (LIORESAL) 10 MG tablet Take 10 mg by mouth 3 (three) times daily as needed for spasms.   cetirizine  (ZYRTEC ) 10 MG tablet Take 10 mg by mouth daily.    cetirizine  (ZYRTEC ) 10 MG tablet Take 1 tablet (10 mg total) by mouth daily.   cetirizine  (ZYRTEC ) 10 MG tablet Take 1 tablet (10 mg total) by mouth daily.   cetirizine  (ZYRTEC ) 10 MG tablet Take 1 tablet (10 mg total) by mouth daily.   citalopram   (CELEXA ) 40 MG tablet Take 40 mg by mouth daily.   clopidogrel  (PLAVIX ) 75 MG tablet Take 1 tablet (75 mg total) by mouth daily.   clopidogrel  (PLAVIX ) 75 MG tablet Take 1 tablet (75 mg total) by mouth daily.   docusate sodium  (COLACE) 50 MG capsule Take 50 mg by mouth daily as needed for mild constipation.   esomeprazole  (NEXIUM ) 40 MG capsule Take 1 capsule (40 mg total) by mouth 2 (two) times daily.   fluticasone  (FLONASE ) 50 MCG/ACT nasal spray Place 2 sprays into both nostrils as needed for allergies or rhinitis.   isosorbide  mononitrate (IMDUR ) 60 MG 24 hr tablet Take 60 mg by mouth every morning.   lactulose  (CHRONULAC ) 10 GM/15ML solution Take 15 mLs (10 g total) by mouth daily as needed for constipation   lidocaine  (LIDODERM ) 5 % Place 1 patch onto the skin daily as needed (pain). Remove & Discard patch within 12 hours or as directed by MD   Multiple Vitamin (MULTIVITAMIN WITH MINERALS) TABS tablet Take 1 tablet by mouth daily.   nitroGLYCERIN  (NITROSTAT ) 0.4 MG SL tablet Place 1 tablet (0.4 mg total) under the tongue every 5 (five) minutes x 3 doses as needed for chest pain.   olopatadine  (PATADAY ) 0.1 % ophthalmic solution Place 1-2 drops into affected eye(s) 2 (two) times daily for 6-8 hours.   oxyCODONE  (ROXICODONE ) 15 MG immediate release tablet Take 1 tablet (15 mg total) by mouth every 6 (six) hours as needed.   ranolazine  (RANEXA ) 500 MG 12 hr tablet Take 1 tablet (500 mg total) by mouth 2 (two) times daily.   ranolazine  (RANEXA ) 500 MG 12 hr tablet Take 1 tablet (500 mg total) by mouth 2 (two) times daily.   sildenafil  (VIAGRA ) 100 MG tablet Take 1 tablet (100 mg total) by mouth daily as needed for erectile dysfunction.   tamsulosin  (FLOMAX ) 0.4 MG CAPS capsule Take 1 capsule (0.4 mg total) by mouth at bedtime.   Ubrogepant  (UBRELVY ) 100 MG TABS Take 1 tablet (100 mg total) by mouth 1 time. May repeat once in 2 or more hours as needed   Ubrogepant  (UBRELVY ) 100 MG TABS Take 1  tablet (100 mg total) by mouth once, then may repeat in 2 hours or later if needed   Ubrogepant  (UBRELVY ) 100 MG TABS Take 1 tablet (100 mg total) by mouth as needed for headache may repeat in 2 hours as needed   [  DISCONTINUED] carvedilol  (COREG ) 12.5 MG tablet Take 1 tablet (12.5 mg total) by mouth 2 (two) times daily.   [DISCONTINUED] carvedilol  (COREG ) 12.5 MG tablet Take 1 tablet (12.5 mg total) by mouth 2 (two) times daily.     Allergies:   Latex   Social History   Socioeconomic History   Marital status: Divorced    Spouse name: Not on file   Number of children: Not on file   Years of education: Not on file   Highest education level: Not on file  Occupational History   Occupation: disabled    Comment: Curator    Comment: former Paediatric nurse "breaker"  Tobacco Use   Smoking status: Former    Types: Cigars    Quit date: 03/25/1988    Years since quitting: 35.3    Passive exposure: Past   Smokeless tobacco: Former    Types: Chew   Tobacco comments:    Smoked 6 cigars a day  Vaping Use   Vaping status: Never Used  Substance and Sexual Activity   Alcohol use: Yes    Comment: occ   Drug use: Not Currently   Sexual activity: Not on file  Other Topics Concern   Not on file  Social History Narrative   Not on file   Social Drivers of Health   Financial Resource Strain: Not on file  Food Insecurity: Not on file  Transportation Needs: Not on file  Physical Activity: Not on file  Stress: Not on file  Social Connections: Not on file     Family History: The patient's family history includes Allergies in his mother; Hypertension in his mother; Rheum arthritis in his mother. ROS:   Please see the history of present illness.    All 14 point review of systems negative except as described per history of present illness.  EKGs/Labs/Other Studies Reviewed:    The following studies were reviewed today:   EKG:  EKG Interpretation Date/Time:  Friday July 18 2023 13:29:38  EDT Ventricular Rate:  71 PR Interval:  144 QRS Duration:  94 QT Interval:  428 QTC Calculation: 465 R Axis:   40  Text Interpretation: Normal sinus rhythm Possible Anterior infarct , age undetermined ST & T wave abnormality, consider inferior ischemia Abnormal ECG When compared with ECG of 22-Nov-2018 01:54, Questionable change in QRS axis Confirmed by Bertha Broad reddy [phone]) on 07/18/2023 2:04:42 PM    Recent Labs: No results found for requested labs within last 365 days.  Recent Lipid Panel    Component Value Date/Time   CHOL 182 01/19/2019 1117   TRIG 234 (H) 01/19/2019 1117   HDL 50 01/19/2019 1117   CHOLHDL 3.6 01/19/2019 1117   CHOLHDL 3.7 11/22/2018 0347   VLDL 28 11/22/2018 0347   LDLCALC 93 01/19/2019 1117    Physical Exam:    VS:  BP 126/66   Pulse 71   Ht 5\' 6"  (1.676 m)   Wt 178 lb (80.7 kg)   SpO2 95%   BMI 28.73 kg/m     Wt Readings from Last 3 Encounters:  07/18/23 178 lb (80.7 kg)  07/24/22 173 lb 3.2 oz (78.6 kg)  04/16/21 197 lb (89.4 kg)     GENERAL:  Well nourished, well developed in no acute distress NECK: No JVD; No carotid bruits CARDIAC: RRR, S1 and S2 present, no murmurs, no rubs, no gallops CHEST:  Clear to auscultation without rales, wheezing or rhonchi  Extremities: No pitting pedal edema. Pulses bilaterally symmetric with  radial 2+ and dorsalis pedis 2+ NEUROLOGIC:  Alert and oriented x 3  Medication Adjustments/Labs and Tests Ordered: Current medicines are reviewed at length with the patient today.  Concerns regarding medicines are outlined above.  Orders Placed This Encounter  Procedures   EKG 12-Lead   Meds ordered this encounter  Medications   carvedilol  (COREG ) 12.5 MG tablet    Sig: Take 1 tablet (12.5 mg total) by mouth 2 (two) times daily.    Dispense:  180 tablet    Refill:  3    Signed, Candus Braud reddy Colisha Redler, MD, MPH, [facility]. 07/18/2023 2:25 PM    [facility]

## 2023-07-18 NOTE — Addendum Note (Signed)
 Addended by: Jacqulyne Gladue P on: 07/18/2023 02:33 PM   Modules accepted: Orders

## 2023-07-18 NOTE — Patient Instructions (Signed)
 Medication Instructions:  Stop imdur .    Lab Work: None Ordered If you have labs (blood work) drawn today and your tests are completely normal, you will receive your results only by: MyChart Message (if you have MyChart) OR A paper copy in the mail If you have any lab test that is abnormal or we need to change your treatment, we will call you to review the results.   Testing/Procedures:    Follow-Up: At [facility], you and your health needs are our priority.  As part of our continuing mission to provide you with exceptional heart care, we have created designated Provider Care Teams.  These Care Teams include your primary Cardiologist (physician) and Advanced Practice Providers (APPs -  Physician Assistants and Nurse Practitioners) who all work together to provide you with the care you need, when you need it.  We recommend signing up for the patient portal called "MyChart".  Sign up information is provided on this After Visit Summary.  MyChart is used to connect with patients for Virtual Visits (Telemedicine).  Patients are able to view lab/test results, encounter notes, upcoming appointments, etc.  Non-urgent messages can be sent to your provider as well.   To learn more about what you can do with MyChart, go to [email or web address].    Your next appointment:   6 month

## 2023-07-18 NOTE — Assessment & Plan Note (Signed)
 Doing well at baseline. CCS class I. Continue Plavix  75 mg once daily. Mentions he cannot tolerate aspirin  due to epistaxis.  On antianginal regimen with carvedilol  12.5 mg twice daily Ranexa  500 mg twice daily Has been continuing to take Imdur  however this was recommended were discontinued at last visit in the office Cialis and Viagra  use. Discontinue Imdur . I suspect he will do fine without Imdur  given his good functional status in fact his lightheadedness could have been related to ongoing use of Imdur .  Cautioned him extensively that Imdur  should not be used along with Viagra . Marked the bottle for him to put aside once he returns home.

## 2023-07-18 NOTE — Assessment & Plan Note (Signed)
 Well-controlled. Target below 130/80 mmHg. Continue carvedilol  12.5 mg twice daily

## 2023-07-18 NOTE — Assessment & Plan Note (Signed)
 No recent lipid panel. Currently not fasting. Continue atorvastatin  40 mg once daily. Will consider fasting lipid panel at subsequent follow-up visit in 6 months if he has not had one through his PCPs and his annual visit.

## 2023-07-21 ENCOUNTER — Other Ambulatory Visit (HOSPITAL_BASED_OUTPATIENT_CLINIC_OR_DEPARTMENT_OTHER): Payer: Self-pay

## 2023-07-21 MED ORDER — OXYCODONE HCL 15 MG PO TABA
1.0000 | ORAL_TABLET | Freq: Four times a day (QID) | ORAL | 0 refills | Status: DC | PRN
Start: 2023-07-21 — End: 2023-07-21
  Filled 2023-07-21: qty 120, 30d supply, fill #0

## 2023-07-21 MED ORDER — CEPHALEXIN 500 MG PO CAPS
500.0000 mg | ORAL_CAPSULE | Freq: Two times a day (BID) | ORAL | 0 refills | Status: AC
Start: 2023-07-21 — End: 2023-07-31
  Filled 2023-07-21: qty 20, 10d supply, fill #0

## 2023-07-21 MED ORDER — OXYCODONE HCL 15 MG PO TABS
15.0000 mg | ORAL_TABLET | Freq: Four times a day (QID) | ORAL | 0 refills | Status: DC | PRN
  Filled 2023-07-21: qty 100, 25d supply, fill #0
  Filled 2023-07-21: qty 20, 5d supply, fill #0

## 2023-07-22 ENCOUNTER — Other Ambulatory Visit (HOSPITAL_BASED_OUTPATIENT_CLINIC_OR_DEPARTMENT_OTHER): Payer: Self-pay

## 2023-07-22 MED ORDER — TESTOSTERONE CYPIONATE 200 MG/ML IM SOLN
200.0000 mg | INTRAMUSCULAR | 0 refills | Status: DC
  Filled 2023-07-22: qty 2, 28d supply, fill #0

## 2023-07-23 ENCOUNTER — Other Ambulatory Visit (HOSPITAL_BASED_OUTPATIENT_CLINIC_OR_DEPARTMENT_OTHER): Payer: Self-pay

## 2023-08-04 ENCOUNTER — Other Ambulatory Visit (HOSPITAL_BASED_OUTPATIENT_CLINIC_OR_DEPARTMENT_OTHER): Payer: Self-pay

## 2023-08-04 MED ORDER — AMOXICILLIN 500 MG PO CAPS
2000.0000 mg | ORAL_CAPSULE | ORAL | 1 refills | Status: AC
  Filled 2023-08-04: qty 4, 1d supply, fill #0

## 2023-08-20 ENCOUNTER — Other Ambulatory Visit (HOSPITAL_BASED_OUTPATIENT_CLINIC_OR_DEPARTMENT_OTHER): Payer: Self-pay

## 2023-08-20 MED ORDER — NALOXONE HCL 4 MG/0.1ML NA LIQD
NASAL | 0 refills | Status: AC
  Filled 2023-08-20: qty 2, 2d supply, fill #0

## 2023-08-21 ENCOUNTER — Other Ambulatory Visit (HOSPITAL_BASED_OUTPATIENT_CLINIC_OR_DEPARTMENT_OTHER): Payer: Self-pay

## 2023-08-21 MED ORDER — OXYCODONE HCL 15 MG PO TABS
15.0000 mg | ORAL_TABLET | Freq: Four times a day (QID) | ORAL | 0 refills | Status: DC | PRN
  Filled 2023-08-21: qty 40, 10d supply, fill #0
  Filled 2023-08-21: qty 80, 20d supply, fill #0

## 2023-08-22 ENCOUNTER — Other Ambulatory Visit (HOSPITAL_BASED_OUTPATIENT_CLINIC_OR_DEPARTMENT_OTHER): Payer: Self-pay

## 2023-09-03 ENCOUNTER — Other Ambulatory Visit (HOSPITAL_BASED_OUTPATIENT_CLINIC_OR_DEPARTMENT_OTHER): Payer: Self-pay

## 2023-09-03 MED ORDER — CLOPIDOGREL BISULFATE 75 MG PO TABS
75.0000 mg | ORAL_TABLET | Freq: Every day | ORAL | 1 refills | Status: DC
  Filled 2023-09-03: qty 90, 90d supply, fill #0

## 2023-09-03 MED ORDER — ESOMEPRAZOLE MAGNESIUM 40 MG PO CPDR
40.0000 mg | DELAYED_RELEASE_CAPSULE | Freq: Two times a day (BID) | ORAL | 1 refills | Status: DC
  Filled 2023-09-03: qty 180, 90d supply, fill #0

## 2023-09-03 MED ORDER — CEPHALEXIN 500 MG PO CAPS
500.0000 mg | ORAL_CAPSULE | Freq: Three times a day (TID) | ORAL | 0 refills | Status: DC
  Filled 2023-09-03: qty 21, 7d supply, fill #0

## 2023-09-03 MED ORDER — BACLOFEN 10 MG PO TABS
10.0000 mg | ORAL_TABLET | Freq: Three times a day (TID) | ORAL | 3 refills | Status: DC
  Filled 2023-09-03: qty 90, 30d supply, fill #0
  Filled 2023-10-09: qty 90, 30d supply, fill #1

## 2023-09-03 MED ORDER — CITALOPRAM HYDROBROMIDE 40 MG PO TABS
40.0000 mg | ORAL_TABLET | Freq: Every day | ORAL | 0 refills | Status: DC
  Filled 2023-09-03: qty 90, 90d supply, fill #0

## 2023-09-03 MED ORDER — RANOLAZINE ER 500 MG PO TB12
500.0000 mg | ORAL_TABLET | Freq: Two times a day (BID) | ORAL | 1 refills | Status: AC
  Filled 2023-09-03: qty 180, 90d supply, fill #0
  Filled 2024-02-04: qty 180, 90d supply, fill #1

## 2023-09-04 ENCOUNTER — Other Ambulatory Visit (HOSPITAL_BASED_OUTPATIENT_CLINIC_OR_DEPARTMENT_OTHER): Payer: Self-pay

## 2023-09-04 MED ORDER — CLOPIDOGREL BISULFATE 75 MG PO TABS
75.0000 mg | ORAL_TABLET | Freq: Every day | ORAL | 1 refills | Status: DC
  Filled 2023-09-04: qty 90, 90d supply, fill #0
  Filled 2023-12-02: qty 90, 90d supply, fill #1

## 2023-09-04 MED ORDER — ESOMEPRAZOLE MAGNESIUM 40 MG PO CPDR
40.0000 mg | DELAYED_RELEASE_CAPSULE | Freq: Two times a day (BID) | ORAL | 1 refills | Status: AC
  Filled 2023-12-15: qty 180, 90d supply, fill #0
  Filled 2024-03-15: qty 180, 90d supply, fill #1

## 2023-09-04 MED ORDER — BACLOFEN 10 MG PO TABS
10.0000 mg | ORAL_TABLET | Freq: Three times a day (TID) | ORAL | 3 refills | Status: DC
  Filled 2023-11-14: qty 90, 30d supply, fill #0
  Filled 2023-12-15: qty 90, 30d supply, fill #1
  Filled 2024-01-26: qty 90, 30d supply, fill #2
  Filled 2024-03-01: qty 90, 30d supply, fill #3

## 2023-09-05 ENCOUNTER — Other Ambulatory Visit (HOSPITAL_BASED_OUTPATIENT_CLINIC_OR_DEPARTMENT_OTHER): Payer: Self-pay

## 2023-09-08 ENCOUNTER — Other Ambulatory Visit (HOSPITAL_BASED_OUTPATIENT_CLINIC_OR_DEPARTMENT_OTHER): Payer: Self-pay

## 2023-09-08 MED ORDER — MUPIROCIN 2 % EX OINT
1.0000 | TOPICAL_OINTMENT | Freq: Two times a day (BID) | CUTANEOUS | 0 refills | Status: AC
  Filled 2023-09-08: qty 22, 11d supply, fill #0

## 2023-09-19 ENCOUNTER — Other Ambulatory Visit (HOSPITAL_BASED_OUTPATIENT_CLINIC_OR_DEPARTMENT_OTHER): Payer: Self-pay

## 2023-09-19 MED ORDER — UBRELVY 100 MG PO TABS
100.0000 mg | ORAL_TABLET | Freq: Once | ORAL | 0 refills | Status: AC
  Filled 2023-09-19: qty 16, 30d supply, fill #0

## 2023-09-19 MED ORDER — ISOSORBIDE MONONITRATE ER 60 MG PO TB24
60.0000 mg | ORAL_TABLET | Freq: Every morning | ORAL | 0 refills | Status: DC
  Filled 2023-09-19: qty 90, 90d supply, fill #0

## 2023-09-19 MED ORDER — OLOPATADINE HCL 0.1 % OP SOLN
1.0000 [drp] | Freq: Two times a day (BID) | OPHTHALMIC | 3 refills | Status: DC
  Filled 2023-09-19: qty 5, 25d supply, fill #0

## 2023-09-19 MED ORDER — SULFAMETHOXAZOLE-TRIMETHOPRIM 800-160 MG PO TABS
1.0000 | ORAL_TABLET | Freq: Two times a day (BID) | ORAL | 0 refills | Status: DC
  Filled 2023-09-19: qty 14, 7d supply, fill #0

## 2023-09-19 MED ORDER — OXYCODONE HCL 15 MG PO TABS
15.0000 mg | ORAL_TABLET | Freq: Four times a day (QID) | ORAL | 0 refills | Status: DC | PRN
  Filled 2023-09-19: qty 120, 30d supply, fill #0

## 2023-09-19 MED ORDER — TESTOSTERONE CYPIONATE 200 MG/ML IM SOLN
200.0000 mg | INTRAMUSCULAR | 0 refills | Status: DC
  Filled 2023-09-19: qty 2, 28d supply, fill #0

## 2023-10-07 ENCOUNTER — Other Ambulatory Visit (HOSPITAL_BASED_OUTPATIENT_CLINIC_OR_DEPARTMENT_OTHER): Payer: Self-pay

## 2023-10-09 ENCOUNTER — Other Ambulatory Visit (HOSPITAL_BASED_OUTPATIENT_CLINIC_OR_DEPARTMENT_OTHER): Payer: Self-pay

## 2023-10-17 ENCOUNTER — Other Ambulatory Visit (HOSPITAL_BASED_OUTPATIENT_CLINIC_OR_DEPARTMENT_OTHER): Payer: Self-pay

## 2023-10-17 MED ORDER — CARVEDILOL 12.5 MG PO TABS
12.5000 mg | ORAL_TABLET | Freq: Two times a day (BID) | ORAL | 0 refills | Status: DC
  Filled 2023-10-17: qty 180, 90d supply, fill #0

## 2023-10-17 MED ORDER — TESTOSTERONE CYPIONATE 200 MG/ML IM SOLN
200.0000 mg | INTRAMUSCULAR | 0 refills | Status: DC
  Filled 2023-10-17: qty 2, 28d supply, fill #0

## 2023-10-17 MED ORDER — CETIRIZINE HCL 10 MG PO TABS
10.0000 mg | ORAL_TABLET | Freq: Every day | ORAL | 1 refills | Status: DC
  Filled 2023-10-17: qty 90, 90d supply, fill #0

## 2023-10-17 MED ORDER — ATORVASTATIN CALCIUM 40 MG PO TABS
40.0000 mg | ORAL_TABLET | Freq: Every evening | ORAL | 1 refills | Status: AC
  Filled 2023-10-17: qty 90, 90d supply, fill #0
  Filled 2024-02-12: qty 90, 90d supply, fill #1

## 2023-10-17 MED ORDER — OXYCODONE HCL 15 MG PO TABS
15.0000 mg | ORAL_TABLET | Freq: Four times a day (QID) | ORAL | 0 refills | Status: DC | PRN
Start: 2023-10-17 — End: 2023-11-14
  Filled 2023-10-17: qty 120, 30d supply, fill #0

## 2023-10-28 ENCOUNTER — Other Ambulatory Visit: Payer: Self-pay

## 2023-10-28 DIAGNOSIS — I739 Peripheral vascular disease, unspecified: Secondary | ICD-10-CM

## 2023-10-29 ENCOUNTER — Other Ambulatory Visit (HOSPITAL_BASED_OUTPATIENT_CLINIC_OR_DEPARTMENT_OTHER): Payer: Self-pay

## 2023-11-14 ENCOUNTER — Other Ambulatory Visit (HOSPITAL_BASED_OUTPATIENT_CLINIC_OR_DEPARTMENT_OTHER): Payer: Self-pay

## 2023-11-14 MED ORDER — TESTOSTERONE CYPIONATE 200 MG/ML IM SOLN
200.0000 mg | INTRAMUSCULAR | 0 refills | Status: DC
  Filled 2023-11-14: qty 2, 28d supply, fill #0

## 2023-11-14 MED ORDER — CITALOPRAM HYDROBROMIDE 40 MG PO TABS
40.0000 mg | ORAL_TABLET | Freq: Every day | ORAL | 0 refills | Status: DC
  Filled 2023-11-14: qty 90, 90d supply, fill #0

## 2023-11-14 MED ORDER — CARVEDILOL 12.5 MG PO TABS
12.5000 mg | ORAL_TABLET | Freq: Two times a day (BID) | ORAL | 0 refills | Status: DC
  Filled 2023-11-14: qty 180, 90d supply, fill #0

## 2023-11-14 MED ORDER — ISOSORBIDE MONONITRATE ER 60 MG PO TB24
60.0000 mg | ORAL_TABLET | Freq: Every day | ORAL | 0 refills | Status: AC
  Filled 2023-11-14 – 2023-11-17 (×2): qty 90, 90d supply, fill #0

## 2023-11-14 MED ORDER — OXYCODONE HCL 15 MG PO TABS
15.0000 mg | ORAL_TABLET | Freq: Four times a day (QID) | ORAL | 0 refills | Status: DC | PRN
  Filled 2023-11-14: qty 120, 30d supply, fill #0

## 2023-11-17 ENCOUNTER — Other Ambulatory Visit (HOSPITAL_BASED_OUTPATIENT_CLINIC_OR_DEPARTMENT_OTHER): Payer: Self-pay

## 2023-11-28 ENCOUNTER — Ambulatory Visit (HOSPITAL_COMMUNITY)
Admission: RE | Admit: 2023-11-28 | Discharge: 2023-11-28 | Disposition: A | Discharge status: Home / Self Care | Source: Ambulatory Visit | Attending: Vascular Surgery | Admitting: Vascular Surgery

## 2023-11-28 ENCOUNTER — Ambulatory Visit: Admitting: Vascular Surgery

## 2023-11-28 ENCOUNTER — Other Ambulatory Visit (HOSPITAL_BASED_OUTPATIENT_CLINIC_OR_DEPARTMENT_OTHER): Payer: Self-pay

## 2023-11-28 DIAGNOSIS — I739 Peripheral vascular disease, unspecified: Secondary | ICD-10-CM | POA: Insufficient documentation

## 2023-11-28 LAB — VAS US ABI WITH/WO TBI
Left ABI: 1.36
Right ABI: 1.51

## 2023-12-02 ENCOUNTER — Other Ambulatory Visit (HOSPITAL_BASED_OUTPATIENT_CLINIC_OR_DEPARTMENT_OTHER): Payer: Self-pay

## 2023-12-15 ENCOUNTER — Other Ambulatory Visit (HOSPITAL_BASED_OUTPATIENT_CLINIC_OR_DEPARTMENT_OTHER): Payer: Self-pay

## 2023-12-16 ENCOUNTER — Other Ambulatory Visit: Payer: Self-pay

## 2023-12-16 ENCOUNTER — Other Ambulatory Visit (HOSPITAL_BASED_OUTPATIENT_CLINIC_OR_DEPARTMENT_OTHER): Payer: Self-pay

## 2023-12-16 MED ORDER — OXYCODONE HCL 15 MG PO TABS
15.0000 mg | ORAL_TABLET | Freq: Four times a day (QID) | ORAL | 0 refills | Status: DC | PRN
  Filled 2023-12-16: qty 120, 30d supply, fill #0

## 2023-12-31 NOTE — Progress Notes (Unsigned)
 Patient ID: Gary Diaz, male   DOB: 07/27/1955, 68 y.o.   MRN: [id number]  Reason for Consult: No chief complaint on file.   Referred by Theo Eleanor PARAS, PA-C  Subjective:     HPI Gary Diaz is a 68 y.o. male presenting for evaluation of leg pain. *** He is compliant with Plavix  and statin, he is a previous smoker and quit many years ago.  Past Medical History:  Diagnosis Date   Acute blood loss anemia 06/12/2015   Acute lower GI bleeding 06/12/2015   Acute myocardial infarction [facility]) 05/14/2010   Qualifier: History of   By: Nikki Colander      IMO SNOMED Dx Update Oct 2024     Asthma    Bronchiectasis with acute exacerbation (HCC) 11/17/2012   Bronchiectasis without acute exacerbation (HCC) 06/21/2010   Ct chest 2011:  bilat lower lobe bronchiectasis with tiny nodular infiltrates scattered in the area (?MAC colonization) Spirometry 2012:  No airflow obstruction Spirometry 2014:  Normal.  Evelyn 2016:  Normal flows.     Cervicalgia 10/21/2017   Chest pain of uncertain etiology    Cholelithiasis 05/29/2015   Chronic midline low back pain without sciatica 10/21/2017   Coronary artery disease involving native coronary artery with angina pectoris 11/14/2014   Overview:  1.Taxus Stent to the LAD 06/30/07 with patent previous LAD stents  2. Cath 05/21/2011 without restenosis or obstructive stenosis 3. Cath 03/04/13 with patent stent, EF 55-60%.   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   TIA (transient ischemic attack) 05/14/2010   Qualifier: Diagnosis of  By: Nikki Colander     Family History  Problem Relation Age of Onset   Allergies Mother    Hypertension Mother    Rheum arthritis Mother    Past Surgical History:  Procedure Laterality Date   COLON SURGERY  2017   blood loss   CORONARY STENT PLACEMENT     x 3   JOINT REPLACEMENT Bilateral    hips   LEFT HEART CATH AND CORONARY ANGIOGRAPHY N/A 11/23/2018   Procedure: LEFT HEART CATH AND CORONARY ANGIOGRAPHY;  Surgeon: Verlin Lonni BIRCH, MD;  Location: [facility];  Service: Cardiovascular;  Laterality: N/A;   REVERSE SHOULDER ARTHROPLASTY Right 04/28/2020   Procedure: REVERSE SHOULDER ARTHROPLASTY;  Surgeon: Kay Kemps, MD;  Location: [facility] OR[facility];  Service: Orthopedics;  Laterality: Right;   ROTATOR CUFF REPAIR     right   TOTAL HIP ARTHROPLASTY     x 2    Short Social History:  Social History   Tobacco Use  Smoking status: Former    Types: Cigars    Quit date: 03/25/1988    Years since quitting: 35.7    Passive exposure: Past   Smokeless tobacco: Former    Types: Chew   Tobacco comments:    Smoked 6 cigars a day  Substance Use Topics   Alcohol use: Yes    Comment: occ    Allergies  Allergen Reactions   Latex Other (See Comments)    blisters    Current Outpatient Medications  Medication Sig Dispense Refill   albuterol  (VENTOLIN  HFA) 108 (90 Base) MCG/ACT inhaler Inhale 2 puffs into the lungs every 6 (six) hours as needed (shortness of breath).     amoxicillin   (AMOXIL ) 500 MG capsule Take 4 capsules (2,000 mg total) by mouth 1 hour before procedure 4 capsule 1   atorvastatin  (LIPITOR ) 40 MG tablet Take 1 tablet (40 mg total) by mouth every evening. 90 tablet 1   baclofen  (LIORESAL ) 10 MG tablet Take 10 mg by mouth 3 (three) times daily as needed for spasms.     baclofen  (LIORESAL ) 10 MG tablet Take 1 tablet (10 mg total) by mouth 3 (three) times daily. 90 tablet 3   baclofen  (LIORESAL ) 10 MG tablet Take 1 tablet (10 mg total) by mouth 3 (three) times daily. 90 tablet 3   carvedilol  (COREG ) 12.5 MG tablet Take 1 tablet (12.5 mg total) by mouth 2 (two) times daily. 180 tablet 3   carvedilol  (COREG ) 12.5 MG tablet Take 1 tablet (12.5 mg total) by mouth 2 (two) times daily. 180 tablet 0   cephALEXin  (KEFLEX ) 500 MG capsule Take 1 capsule (500 mg total) by mouth 3 (three) times daily. 21 capsule 0   cetirizine  (ZYRTEC ) 10 MG tablet Take 10 mg by mouth daily.      cetirizine  (ZYRTEC ) 10 MG tablet Take 1 tablet (10 mg total) by mouth daily. 90 tablet 1   cetirizine  (ZYRTEC ) 10 MG tablet Take 1 tablet (10 mg total) by mouth daily. 90 tablet 1   cetirizine  (ZYRTEC ) 10 MG tablet Take 1 tablet (10 mg total) by mouth daily. 90 tablet 1   cetirizine  (ZYRTEC ) 10 MG tablet Take 1 tablet (10 mg total) by mouth daily. 90 tablet 1   citalopram  (CELEXA ) 40 MG tablet Take 40 mg by mouth daily.     citalopram  (CELEXA ) 40 MG tablet Take 1 tablet (40 mg total) by mouth daily in the evening 90 tablet 0   clopidogrel  (PLAVIX ) 75 MG tablet Take 1 tablet (75 mg total) by mouth daily. 90 tablet 1   clopidogrel  (PLAVIX ) 75 MG tablet Take 1 tablet (75 mg total) by mouth daily. 90 tablet 1   clopidogrel  (PLAVIX ) 75 MG tablet Take 1 tablet (75 mg total) by mouth daily. 90 tablet 1   clopidogrel  (PLAVIX ) 75 MG tablet Take 1 tablet (75 mg total) by mouth daily. 90 tablet 1   docusate sodium  (COLACE) 50 MG capsule Take 50 mg by mouth daily as needed for mild constipation.      esomeprazole  (NEXIUM ) 40 MG capsule Take 1 capsule (40 mg total) by mouth 2 (two) times daily. 180 capsule 1   esomeprazole  (NEXIUM ) 40 MG capsule Take 1 capsule (40 mg total) by mouth 2 (two) times daily. 180 capsule 1   esomeprazole  (NEXIUM ) 40 MG capsule Take 1 capsule (40 mg total) by mouth 2 (two) times daily. 180 capsule 1   fluticasone  (FLONASE ) 50 MCG/ACT nasal spray Place 2 sprays into both nostrils  as needed for allergies or rhinitis.     isosorbide  mononitrate (IMDUR ) 60 MG 24 hr tablet Take 1 tablet (60 mg total) by mouth in the morning. 90 tablet 0   isosorbide  mononitrate (IMDUR ) 60 MG 24 hr tablet Take 1 tablet (60 mg total) by mouth daily in the morning 90 tablet 0   lactulose  (CHRONULAC ) 10 GM/15ML solution Take 15 mLs (10 g total) by mouth daily as needed for constipation 450 mL 4   lidocaine  (LIDODERM ) 5 % Place 1 patch onto the skin daily as needed (pain). Remove & Discard patch within 12 hours or as directed by MD     Multiple Vitamin (MULTIVITAMIN WITH MINERALS) TABS tablet Take 1 tablet by mouth daily.     mupirocin  ointment (BACTROBAN ) 2 % Apply 1 Application to affected skin 2 (two) times daily. 22 g 0   naloxone  (NARCAN ) nasal spray 4 mg/0.1 mL Instill 1 spray into ONE nostril; alternate nostrils with each dose every 2 minutes until help arrives for opioid overdose. 2 each 0   nitroGLYCERIN  (NITROSTAT ) 0.4 MG SL tablet Place 1 tablet (0.4 mg total) under the tongue every 5 (five) minutes x 3 doses as needed for chest pain. 30 tablet 3   olopatadine  (PATADAY ) 0.1 % ophthalmic solution Place 1-2 drops into affected eye(s) 2 (two) times daily for 6-8 hours. 5 mL 3   olopatadine  (PATANOL) 0.1 % ophthalmic solution Place 1-2 drops into the affected eye twice daily at an interval of 6-8 hours. 5 mL 3   oxyCODONE  (ROXICODONE ) 15 MG immediate release tablet Take 1 tablet (15 mg total) by mouth every 6 (six) hours as needed for pain 120 tablet 0   ranolazine  (RANEXA ) 500 MG 12 hr  tablet Take 1 tablet (500 mg total) by mouth 2 (two) times daily. 180 tablet 1   ranolazine  (RANEXA ) 500 MG 12 hr tablet Take 1 tablet (500 mg total) by mouth 2 (two) times daily. 180 tablet 1   ranolazine  (RANEXA ) 500 MG 12 hr tablet Take 1 tablet (500 mg total) by mouth 2 (two) times daily. 180 tablet 1   sildenafil  (VIAGRA ) 100 MG tablet Take 1 tablet (100 mg total) by mouth daily as needed for erectile dysfunction. 10 tablet 1   sulfamethoxazole -trimethoprim  (BACTRIM  DS) 800-160 MG tablet Take 1 tablet by mouth 2 (two) times daily. 14 tablet 0   tamsulosin  (FLOMAX ) 0.4 MG CAPS capsule Take 1 capsule (0.4 mg total) by mouth at bedtime. 90 capsule 1   testosterone  cypionate (DEPOTESTOSTERONE CYPIONATE) 200 MG/ML injection Inject 1 mL (200 mg total) into the muscle every 14 (fourteen) days. 2 mL 0   Ubrogepant  (UBRELVY ) 100 MG TABS Take 1 tablet (100 mg total) by mouth 1 time. May repeat once in 2 or more hours as needed 16 tablet 3   Ubrogepant  (UBRELVY ) 100 MG TABS Take 1 tablet (100 mg total) by mouth once, then may repeat in 2 hours or later if needed 48 tablet 1   Ubrogepant  (UBRELVY ) 100 MG TABS Take 1 tablet (100 mg total) by mouth as needed for headache may repeat in 2 hours as needed 48 tablet 5   No current facility-administered medications for this visit.    REVIEW OF SYSTEMS  All other systems were reviewed and are negative     Objective:  Objective   There were no vitals filed for this visit. There is no height or weight on file to calculate BMI.  Physical Exam General: no acute distress Cardiac:  hemodynamically stable Pulm: normal work of breathing Abdomen: non-tender, no pulsatile mass*** Neuro: alert, no focal deficit Extremities: no edema, cyanosis or wounds*** Vascular:   Right: ***  Left: ***  Data: ABI ABI Findings:  +---------+------------------+-----+---------+--------+  Right   Rt Pressure (mmHg)IndexWaveform Comment    +---------+------------------+-----+---------+--------+  Brachial 140                    triphasic          +---------+------------------+-----+---------+--------+  PTA     212               1.51 triphasic          +---------+------------------+-----+---------+--------+  DP      134               0.96 triphasic          +---------+------------------+-----+---------+--------+  Great Toe254               1.81                    +---------+------------------+-----+---------+--------+   +---------+------------------+-----+---------+-------+  Left    Lt Pressure (mmHg)IndexWaveform Comment  +---------+------------------+-----+---------+-------+  Brachial 123                    triphasic         +---------+------------------+-----+---------+-------+  PTA     190               1.36 triphasic         +---------+------------------+-----+---------+-------+  DP      143               1.02 triphasic         +---------+------------------+-----+---------+-------+  Great Toe254               1.81                   +---------+------------------+-----+---------+-------+   +-------+-----------+-----------+------------+------------+  ABI/TBIToday's ABIToday's TBIPrevious ABIPrevious TBI  +-------+-----------+-----------+------------+------------+  Right 1.51       Gary Diaz                                   +-------+-----------+-----------+------------+------------+  Left  1.36       Gary Diaz                                   +-------+-----------+-----------+------------+------------+        Assessment/Plan:   Gary Diaz is a 68 y.o. male with ***  Recommendations to optimize cardiovascular risk: Abstinence from all tobacco products. Blood glucose control with goal A1c < 7%. Blood pressure control with goal blood pressure < 140/90 mmHg. Lipid reduction therapy with goal LDL-C <100 mg/dL  Aspirin  81mg  PO QD.  Atorvastatin   40-80mg  PO QD (or other high intensity statin therapy).   Norman GORMAN Serve MD [facility]

## 2024-01-02 ENCOUNTER — Encounter: Payer: Self-pay | Admitting: Vascular Surgery

## 2024-01-02 ENCOUNTER — Ambulatory Visit: Attending: Vascular Surgery | Admitting: Vascular Surgery

## 2024-01-02 VITALS — BP 113/65 | HR 62 | Temp 98.4°F | Ht 66.0 in | Wt 178.5 lb

## 2024-01-02 DIAGNOSIS — I872 Venous insufficiency (chronic) (peripheral): Secondary | ICD-10-CM

## 2024-01-15 ENCOUNTER — Other Ambulatory Visit: Payer: Self-pay

## 2024-01-15 ENCOUNTER — Other Ambulatory Visit (HOSPITAL_BASED_OUTPATIENT_CLINIC_OR_DEPARTMENT_OTHER): Payer: Self-pay

## 2024-01-15 MED ORDER — OXYCODONE HCL 15 MG PO TABS
15.0000 mg | ORAL_TABLET | Freq: Four times a day (QID) | ORAL | 0 refills | Status: DC | PRN
  Filled 2024-01-15: qty 120, 30d supply, fill #0

## 2024-01-15 MED ORDER — CARVEDILOL 12.5 MG PO TABS
12.5000 mg | ORAL_TABLET | Freq: Two times a day (BID) | ORAL | 0 refills | Status: AC
Start: 2024-01-15 — End: ?
  Filled 2024-01-15: qty 180, 90d supply, fill #0

## 2024-01-15 MED ORDER — TESTOSTERONE CYPIONATE 200 MG/ML IM SOLN
200.0000 mg | INTRAMUSCULAR | 0 refills | Status: DC
  Filled 2024-01-15: qty 2, 28d supply, fill #0

## 2024-01-15 MED ORDER — CLOPIDOGREL BISULFATE 75 MG PO TABS
75.0000 mg | ORAL_TABLET | Freq: Every day | ORAL | 1 refills | Status: AC
  Filled 2024-01-15: qty 90, 90d supply, fill #0

## 2024-01-15 MED ORDER — UBRELVY 100 MG PO TABS
100.0000 mg | ORAL_TABLET | ORAL | 3 refills | Status: AC
  Filled 2024-01-15: qty 16, 30d supply, fill #0
  Filled 2024-03-01: qty 16, 30d supply, fill #1
  Filled 2024-04-06: qty 16, 30d supply, fill #2

## 2024-01-15 MED ORDER — OLOPATADINE HCL 0.1 % OP SOLN
1.0000 [drp] | Freq: Two times a day (BID) | OPHTHALMIC | 3 refills | Status: AC
  Filled 2024-01-15: qty 5, 25d supply, fill #0

## 2024-01-15 MED ORDER — TAMSULOSIN HCL 0.4 MG PO CAPS
0.4000 mg | ORAL_CAPSULE | Freq: Every day | ORAL | 1 refills | Status: AC
  Filled 2024-01-15: qty 90, 90d supply, fill #0

## 2024-01-15 MED ORDER — ALBUTEROL SULFATE HFA 108 (90 BASE) MCG/ACT IN AERS
2.0000 | INHALATION_SPRAY | Freq: Every day | RESPIRATORY_TRACT | 3 refills | Status: AC | PRN
  Filled 2024-01-15: qty 6.7, 25d supply, fill #0

## 2024-01-15 MED ORDER — CETIRIZINE HCL 10 MG PO TABS
10.0000 mg | ORAL_TABLET | Freq: Every day | ORAL | 1 refills | Status: AC
  Filled 2024-01-15: qty 90, 90d supply, fill #0
  Filled 2024-04-06: qty 90, 90d supply, fill #1

## 2024-01-20 ENCOUNTER — Other Ambulatory Visit: Payer: Self-pay

## 2024-01-20 DIAGNOSIS — I739 Peripheral vascular disease, unspecified: Secondary | ICD-10-CM | POA: Insufficient documentation

## 2024-01-21 ENCOUNTER — Other Ambulatory Visit (HOSPITAL_COMMUNITY): Payer: Self-pay

## 2024-01-21 NOTE — Progress Notes (Unsigned)
 Cardiology Office Note:    Date:  01/22/2024   ID:  Gary Diaz, DOB 03/20/1956, MRN [id number]  PCP:  Theo Eleanor PARAS, PA-C  Cardiologist:  Redell Leiter, MD    Referring MD: Theo Eleanor PARAS, PA-C    ASSESSMENT:    1. Coronary artery disease involving native coronary artery of native heart with angina pectoris   2. Essential hypertension   3. Mixed hyperlipidemia   4. PAD (peripheral artery disease)    PLAN:    In order of problems listed above:  Doing quite well asymptomatic [place]  Heart Association class I continue treatment including long-term clopidogrel  oral nitrate beta-blocker and high intensity statin he is also on ranolazine  which is remarkably effective. BP at target continue current treatment beta-blocker I do not see a recent lipid profile we will draw 1 today Stable PAD not having angina or claudication   Next appointment: 1 year   Medication Adjustments/Labs and Tests Ordered: Current medicines are reviewed at length with the patient today.  Concerns regarding medicines are outlined above.  No orders of the defined types were placed in this encounter.  No orders of the defined types were placed in this encounter.    History of Present Illness:    Gary Diaz is a 68 y.o. male with a hx of coronary artery disease hypertension hyperlipidemia left ventricular dysfunction last seen 04/16/2021. His last coronary angiogram August 2020 showed patent stent to the LAD and mild nonobstructive CAD in the right coronary and left circumflex coronary artery. last seen 07/24/2022.  He had ABIs performed in September which showed the right lower extremity noncompressible left lower extremity also noncompressible.  An echocardiogram performed in October 2024 at [facility] showed normal left ventricular size wall thickness ejection fraction improved 55 to 60%. A lipid profile 02/05/2023 hemoglobin is 16 platelets 109,000  Compliance with diet,  lifestyle and medications: Yes Past Medical History:  Diagnosis Date   Acute blood loss anemia 06/12/2015   Acute lower GI bleeding 06/12/2015   Acute myocardial infarction [facility]) 05/14/2010   Qualifier: History of   By: Nikki Colander      IMO SNOMED Dx Update Oct 2024     Asthma    Bronchiectasis with acute exacerbation (HCC) 11/17/2012   Bronchiectasis without acute exacerbation (HCC) 06/21/2010   Ct chest 2011:  bilat lower lobe bronchiectasis with tiny nodular infiltrates scattered in the area (?MAC colonization) Spirometry 2012:  No airflow obstruction Spirometry 2014:  Normal.  Evelyn 2016:  Normal flows.     Cervicalgia 10/21/2017   Chest pain of uncertain etiology    Cholelithiasis 05/29/2015   Chronic midline low back pain without sciatica 10/21/2017   Coronary artery disease involving native coronary artery with angina pectoris 11/14/2014   Overview:  1.Taxus Stent to the LAD 06/30/07 with patent previous LAD stents  2. Cath 05/21/2011 without restenosis or obstructive stenosis 3. Cath 03/04/13 with patent stent, EF 55-60%.   CVA (cerebral vascular accident) (HCC) 2017   Diverticulitis of rectosigmoid 05/29/2015   Dyspnea    Elevated troponin    Essential hypertension 05/14/2010   Qualifier: Diagnosis of  By: Nikki Colander     GERD (gastroesophageal reflux disease)    s/p esophageal dilation for stricture   History of low anterior resection of rectum 06/12/2015   Hyperlipidemia    Hypertension    Hypokalemia 06/12/2015   Hypophosphatemia 06/12/2015   Impingement syndrome of right shoulder 05/09/2016   Leukocytosis 06/12/2015   Mixed  hyperlipidemia 02/29/2016   Overview:  Added automatically from request for surgery [id number]   Myocardial infarction [facility])    +Sees [name]. +CAD -recent nuclear scan with EF 40%, ?ischemia   NSTEMI (non-ST elevated myocardial infarction) (HCC) 11/21/2018   Peripheral vascular disease    Postoperative ileus (HCC) 06/05/2015   Primary  osteoarthritis, right shoulder 05/28/2019   TIA (transient ischemic attack) 05/14/2010   Qualifier: Diagnosis of  By: Nikki Colander      Current Medications: Current Meds  Medication Sig   albuterol  (VENTOLIN  HFA) 108 (90 Base) MCG/ACT inhaler Inhale 2 puffs into the lungs daily as needed.   amoxicillin  (AMOXIL ) 500 MG capsule Take 4 capsules (2,000 mg total) by mouth 1 hour before procedure   atorvastatin  (LIPITOR ) 40 MG tablet Take 1 tablet (40 mg total) by mouth every evening.   baclofen  (LIORESAL ) 10 MG tablet Take 1 tablet (10 mg total) by mouth 3 (three) times daily.   carvedilol  (COREG ) 12.5 MG tablet Take 1 tablet (12.5 mg total) by mouth 2 (two) times daily.   cetirizine  (ZYRTEC ) 10 MG tablet Take 1 tablet (10 mg total) by mouth daily.   citalopram  (CELEXA ) 40 MG tablet Take 1 tablet (40 mg total) by mouth daily in the evening   clopidogrel  (PLAVIX ) 75 MG tablet Take 1 tablet (75 mg total) by mouth daily.   docusate sodium  (COLACE) 50 MG capsule Take 50 mg by mouth daily as needed for mild constipation.   esomeprazole  (NEXIUM ) 40 MG capsule Take 1 capsule (40 mg total) by mouth 2 (two) times daily.   fluticasone  (FLONASE ) 50 MCG/ACT nasal spray Place 2 sprays into both nostrils as needed for allergies or rhinitis.   isosorbide  mononitrate (IMDUR ) 60 MG 24 hr tablet Take 1 tablet (60 mg total) by mouth daily in the morning   lactulose  (CHRONULAC ) 10 GM/15ML solution Take 15 mLs (10 g total) by mouth daily as needed for constipation   lidocaine  (LIDODERM ) 5 % Place 1 patch onto the skin daily as needed (pain). Remove & Discard patch within 12 hours or as directed by MD   Multiple Vitamin (MULTIVITAMIN WITH MINERALS) TABS tablet Take 1 tablet by mouth daily.   mupirocin  ointment (BACTROBAN ) 2 % Apply 1 Application to affected skin 2 (two) times daily.   naloxone  (NARCAN ) nasal spray 4 mg/0.1 mL Instill 1 spray into ONE nostril; alternate nostrils with each dose every 2 minutes until help  arrives for opioid overdose.   nitroGLYCERIN  (NITROSTAT ) 0.4 MG SL tablet Place 1 tablet (0.4 mg total) under the tongue every 5 (five) minutes x 3 doses as needed for chest pain.   olopatadine  (PATANOL) 0.1 % ophthalmic solution Instill 1-2 drops into affected eye 2 (two) times daily at an interval of 6 to 8 hours   oxyCODONE  (ROXICODONE ) 15 MG immediate release tablet Take 1 tablet (15 mg total) by mouth every 6 (six) hours as needed for pain.   ranolazine  (RANEXA ) 500 MG 12 hr tablet Take 1 tablet (500 mg total) by mouth 2 (two) times daily.   sildenafil  (VIAGRA ) 100 MG tablet Take 1 tablet (100 mg total) by mouth daily as needed for erectile dysfunction.   tamsulosin  (FLOMAX ) 0.4 MG CAPS capsule Take 1 capsule (0.4 mg total) by mouth at bedtime.   testosterone  cypionate (DEPOTESTOSTERONE CYPIONATE) 200 MG/ML injection Inject 1 mL (200 mg total) into the muscle every 14 (fourteen) days.   Ubrogepant  (UBRELVY ) 100 MG TABS Take 1 tablet (100 mg total) by mouth once;  as a single dose; may repeat once in >=2 hours after first dose if needed      EKGs/Labs/Other Studies Reviewed:    The following studies were reviewed today:     We did not repeat an EKG at this visit he had 1 in July   Physical Exam:    VS:  BP 120/62   Pulse 72   Ht 5' 6 (1.676 m)   Wt 182 lb 9.6 oz (82.8 kg)   SpO2 98%   BMI 29.47 kg/m     Wt Readings from Last 3 Encounters:  01/22/24 182 lb 9.6 oz (82.8 kg)  01/02/24 178 lb 8 oz (81 kg)  07/18/23 178 lb (80.7 kg)     GEN:  Well nourished, well developed in no acute distress HEENT: Normal NECK: No JVD; No carotid bruits LYMPHATICS: No lymphadenopathy CARDIAC: RRR, no murmurs, rubs, gallops RESPIRATORY:  Clear to auscultation without rales, wheezing or rhonchi  ABDOMEN: Soft, non-tender, non-distended MUSCULOSKELETAL:  No edema; No deformity  SKIN: Warm and dry NEUROLOGIC:  Alert and oriented x 3 PSYCHIATRIC:  Normal affect    Signed, Redell Leiter,  MD  01/22/2024 3:39 PM    [facility]

## 2024-01-22 ENCOUNTER — Encounter: Payer: Self-pay | Admitting: Cardiology

## 2024-01-22 ENCOUNTER — Ambulatory Visit: Attending: Cardiology | Admitting: Cardiology

## 2024-01-22 VITALS — BP 120/62 | HR 72 | Ht 66.0 in | Wt 182.6 lb

## 2024-01-22 DIAGNOSIS — I25119 Atherosclerotic heart disease of native coronary artery with unspecified angina pectoris: Secondary | ICD-10-CM

## 2024-01-22 DIAGNOSIS — I10 Essential (primary) hypertension: Secondary | ICD-10-CM

## 2024-01-22 DIAGNOSIS — E782 Mixed hyperlipidemia: Secondary | ICD-10-CM

## 2024-01-22 DIAGNOSIS — I739 Peripheral vascular disease, unspecified: Secondary | ICD-10-CM

## 2024-01-22 NOTE — Patient Instructions (Signed)
 Medication Instructions:  Your physician recommends that you continue on your current medications as directed. Please refer to the Current Medication list given to you today.  *If you need a refill on your cardiac medications before your next appointment, please call your pharmacy*  Lab Work: Your physician recommends that you return for lab work in:   Labs today: CMP, Lipids, Apo B  If you have labs (blood work) drawn today and your tests are completely normal, you will receive your results only by: MyChart Message (if you have MyChart) OR A paper copy in the mail If you have any lab test that is abnormal or we need to change your treatment, we will call you to review the results.  Testing/Procedures: None  Follow-Up: At [facility], you and your health needs are our priority.  As part of our continuing mission to provide you with exceptional heart care, our providers are all part of one team.  This team includes your primary Cardiologist (physician) and Advanced Practice Providers or APPs (Physician Assistants and Nurse Practitioners) who all work together to provide you with the care you need, when you need it.  Your next appointment:   1 year(s)  Provider:   Redell Leiter, MD    We recommend signing up for the patient portal called MyChart.  Sign up information is provided on this After Visit Summary.  MyChart is used to connect with patients for Virtual Visits (Telemedicine).  Patients are able to view lab/test results, encounter notes, upcoming appointments, etc.  Non-urgent messages can be sent to your provider as well.   To learn more about what you can do with MyChart, go to [email or web address].   Other Instructions Dermasil for moisturizer.

## 2024-01-22 NOTE — Addendum Note (Signed)
 Addended by: SHERRE ADE I on: 01/22/2024 03:51 PM   Modules accepted: Orders

## 2024-01-23 LAB — COMPREHENSIVE METABOLIC PANEL WITH GFR
ALT: 17 IU/L (ref 0–44)
AST: 16 IU/L (ref 0–40)
Albumin: 4.1 g/dL (ref 3.9–4.9)
Alkaline Phosphatase: 61 IU/L (ref 47–123)
BUN/Creatinine Ratio: 11 (ref 10–24)
BUN: 10 mg/dL (ref 8–27)
Bilirubin Total: 0.7 mg/dL (ref 0.0–1.2)
CO2: 28 mmol/L (ref 20–29)
Calcium: 9.3 mg/dL (ref 8.6–10.2)
Chloride: 97 mmol/L (ref 96–106)
Creatinine, Ser: 0.94 mg/dL (ref 0.76–1.27)
Globulin, Total: 2.4 g/dL (ref 1.5–4.5)
Glucose: 97 mg/dL (ref 70–99)
Potassium: 4.5 mmol/L (ref 3.5–5.2)
Sodium: 140 mmol/L (ref 134–144)
Total Protein: 6.5 g/dL (ref 6.0–8.5)
eGFR: 87 mL/min/1.73 (ref >59)

## 2024-01-23 LAB — LIPID PANEL
Chol/HDL Ratio: 3.3 ratio (ref 0.0–5.0)
Cholesterol, Total: 138 mg/dL (ref 100–199)
HDL: 42 mg/dL (ref >39)
LDL Chol Calc (NIH): 74 mg/dL (ref 0–99)
Triglycerides: 123 mg/dL (ref 0–149)
VLDL Cholesterol Cal: 22 mg/dL (ref 5–40)

## 2024-01-23 LAB — APOLIPOPROTEIN B: Apolipoprotein B: 72 mg/dL (ref <90)

## 2024-01-26 ENCOUNTER — Other Ambulatory Visit (HOSPITAL_BASED_OUTPATIENT_CLINIC_OR_DEPARTMENT_OTHER): Payer: Self-pay

## 2024-01-26 ENCOUNTER — Ambulatory Visit: Payer: Self-pay

## 2024-02-02 ENCOUNTER — Other Ambulatory Visit (HOSPITAL_BASED_OUTPATIENT_CLINIC_OR_DEPARTMENT_OTHER): Payer: Self-pay

## 2024-02-02 MED ORDER — DOXYCYCLINE HYCLATE 100 MG PO CAPS
100.0000 mg | ORAL_CAPSULE | Freq: Every day | ORAL | 0 refills | Status: AC
Start: 2024-02-02 — End: 2024-02-07
  Filled 2024-02-02: qty 5, 5d supply, fill #0

## 2024-02-04 ENCOUNTER — Other Ambulatory Visit (HOSPITAL_BASED_OUTPATIENT_CLINIC_OR_DEPARTMENT_OTHER): Payer: Self-pay

## 2024-02-12 ENCOUNTER — Other Ambulatory Visit (HOSPITAL_BASED_OUTPATIENT_CLINIC_OR_DEPARTMENT_OTHER): Payer: Self-pay

## 2024-02-16 ENCOUNTER — Other Ambulatory Visit (HOSPITAL_BASED_OUTPATIENT_CLINIC_OR_DEPARTMENT_OTHER): Payer: Self-pay

## 2024-02-16 MED ORDER — CITALOPRAM HYDROBROMIDE 40 MG PO TABS
40.0000 mg | ORAL_TABLET | Freq: Every evening | ORAL | 0 refills | Status: AC
  Filled 2024-02-16: qty 90, 90d supply, fill #0

## 2024-02-16 MED ORDER — OXYCODONE HCL 15 MG PO TABS
15.0000 mg | ORAL_TABLET | Freq: Four times a day (QID) | ORAL | 0 refills | Status: AC | PRN
Start: 2024-02-16 — End: ?
  Filled 2024-02-16: qty 120, 30d supply, fill #0

## 2024-02-16 MED ORDER — ATORVASTATIN CALCIUM 40 MG PO TABS
40.0000 mg | ORAL_TABLET | Freq: Every evening | ORAL | 1 refills | Status: AC
  Filled 2024-02-16: qty 90, 90d supply, fill #0

## 2024-02-16 MED ORDER — TESTOSTERONE CYPIONATE 200 MG/ML IM SOLN
200.0000 mg | INTRAMUSCULAR | 0 refills | Status: DC
  Filled 2024-02-16: qty 2, 28d supply, fill #0

## 2024-02-16 MED ORDER — CLOPIDOGREL BISULFATE 75 MG PO TABS
75.0000 mg | ORAL_TABLET | Freq: Every day | ORAL | 1 refills | Status: AC
  Filled 2024-02-16: qty 90, 90d supply, fill #0

## 2024-02-16 MED ORDER — ESOMEPRAZOLE MAGNESIUM 40 MG PO CPDR
40.0000 mg | DELAYED_RELEASE_CAPSULE | Freq: Two times a day (BID) | ORAL | 1 refills | Status: AC
  Filled 2024-02-16: qty 180, 90d supply, fill #0

## 2024-02-16 MED ORDER — RANOLAZINE ER 500 MG PO TB12
500.0000 mg | ORAL_TABLET | Freq: Two times a day (BID) | ORAL | 1 refills | Status: AC
  Filled 2024-02-16 – 2024-03-15 (×2): qty 180, 90d supply, fill #0

## 2024-02-16 MED ORDER — LACTULOSE 10 GM/15ML PO SOLN
10.0000 g | Freq: Every day | ORAL | 4 refills | Status: AC | PRN
  Filled 2024-02-16: qty 474, 31d supply, fill #0
  Filled 2024-04-06: qty 474, 31d supply, fill #1

## 2024-03-01 ENCOUNTER — Other Ambulatory Visit (HOSPITAL_BASED_OUTPATIENT_CLINIC_OR_DEPARTMENT_OTHER): Payer: Self-pay

## 2024-03-02 ENCOUNTER — Other Ambulatory Visit (HOSPITAL_BASED_OUTPATIENT_CLINIC_OR_DEPARTMENT_OTHER): Payer: Self-pay

## 2024-03-02 MED ORDER — CITALOPRAM HYDROBROMIDE 40 MG PO TABS
40.0000 mg | ORAL_TABLET | Freq: Every day | ORAL | 0 refills | Status: AC
  Filled 2024-03-02: qty 90, 90d supply, fill #0

## 2024-03-11 MED ORDER — OXYCODONE HCL 15 MG PO TABS
15.0000 mg | ORAL_TABLET | Freq: Four times a day (QID) | ORAL | 0 refills | Status: DC | PRN
  Filled ????-??-??: fill #0

## 2024-03-11 MED ORDER — ISOSORBIDE MONONITRATE ER 60 MG PO TB24
60.0000 mg | ORAL_TABLET | Freq: Every morning | ORAL | 0 refills | Status: AC
  Filled 2024-03-11: qty 90, 90d supply, fill #0

## 2024-03-11 MED ORDER — CARVEDILOL 12.5 MG PO TABS
12.5000 mg | ORAL_TABLET | Freq: Two times a day (BID) | ORAL | 0 refills | Status: AC
  Filled 2024-04-23: qty 60, 30d supply, fill #0

## 2024-03-11 MED ORDER — TESTOSTERONE CYPIONATE 200 MG/ML IM SOLN
200.0000 mg | INTRAMUSCULAR | 2 refills | Status: AC
  Filled 2024-03-11: qty 2, 28d supply, fill #0
  Filled 2024-04-06: qty 2, 28d supply, fill #1

## 2024-03-15 ENCOUNTER — Other Ambulatory Visit (HOSPITAL_BASED_OUTPATIENT_CLINIC_OR_DEPARTMENT_OTHER): Payer: Self-pay

## 2024-03-15 ENCOUNTER — Other Ambulatory Visit: Payer: Self-pay

## 2024-03-15 MED ORDER — RANOLAZINE ER 500 MG PO TB12
500.0000 mg | ORAL_TABLET | Freq: Two times a day (BID) | ORAL | 1 refills | Status: AC
  Filled 2024-03-15: qty 180, 90d supply, fill #0

## 2024-04-06 ENCOUNTER — Other Ambulatory Visit (HOSPITAL_BASED_OUTPATIENT_CLINIC_OR_DEPARTMENT_OTHER): Payer: Self-pay

## 2024-04-06 MED ORDER — BACLOFEN 10 MG PO TABS
10.0000 mg | ORAL_TABLET | Freq: Three times a day (TID) | ORAL | 3 refills | Status: AC
  Filled 2024-04-06: qty 90, 30d supply, fill #0

## 2024-04-09 ENCOUNTER — Other Ambulatory Visit (HOSPITAL_BASED_OUTPATIENT_CLINIC_OR_DEPARTMENT_OTHER): Payer: Self-pay

## 2024-04-09 MED ORDER — OXYCODONE HCL 15 MG PO TABS
15.0000 mg | ORAL_TABLET | Freq: Four times a day (QID) | ORAL | 0 refills | Status: AC | PRN
  Filled 2024-04-12: qty 120, 30d supply, fill #0

## 2024-04-12 ENCOUNTER — Other Ambulatory Visit (HOSPITAL_BASED_OUTPATIENT_CLINIC_OR_DEPARTMENT_OTHER): Payer: Self-pay

## 2024-04-23 ENCOUNTER — Other Ambulatory Visit (HOSPITAL_BASED_OUTPATIENT_CLINIC_OR_DEPARTMENT_OTHER): Payer: Self-pay
